# Patient Record
Sex: Female | Born: 1955 | Race: Black or African American | Hispanic: No | Marital: Single | Smoking: Current every day smoker
Health system: Southern US, Community
[De-identification: ages and names within clinical notes are randomized; demographics above are authoritative.]

## PROBLEM LIST (undated history)

## (undated) DIAGNOSIS — F319 Bipolar disorder, unspecified: Secondary | ICD-10-CM

## (undated) DIAGNOSIS — B182 Chronic viral hepatitis C: Secondary | ICD-10-CM

## (undated) DIAGNOSIS — F32A Depression, unspecified: Secondary | ICD-10-CM

## (undated) DIAGNOSIS — B1920 Unspecified viral hepatitis C without hepatic coma: Secondary | ICD-10-CM

## (undated) DIAGNOSIS — E785 Hyperlipidemia, unspecified: Secondary | ICD-10-CM

## (undated) DIAGNOSIS — Z780 Asymptomatic menopausal state: Secondary | ICD-10-CM

## (undated) DIAGNOSIS — D7589 Other specified diseases of blood and blood-forming organs: Secondary | ICD-10-CM

## (undated) DIAGNOSIS — F1110 Opioid abuse, uncomplicated: Secondary | ICD-10-CM

## (undated) DIAGNOSIS — A539 Syphilis, unspecified: Secondary | ICD-10-CM

## (undated) DIAGNOSIS — R011 Cardiac murmur, unspecified: Secondary | ICD-10-CM

## (undated) DIAGNOSIS — E079 Disorder of thyroid, unspecified: Secondary | ICD-10-CM

## (undated) DIAGNOSIS — R87612 Low grade squamous intraepithelial lesion on cytologic smear of cervix (LGSIL): Secondary | ICD-10-CM

## (undated) DIAGNOSIS — D126 Benign neoplasm of colon, unspecified: Secondary | ICD-10-CM

## (undated) DIAGNOSIS — F209 Schizophrenia, unspecified: Secondary | ICD-10-CM

## (undated) DIAGNOSIS — I429 Cardiomyopathy, unspecified: Secondary | ICD-10-CM

## (undated) DIAGNOSIS — K769 Liver disease, unspecified: Secondary | ICD-10-CM

## (undated) DIAGNOSIS — I10 Essential (primary) hypertension: Secondary | ICD-10-CM

## (undated) DIAGNOSIS — F329 Major depressive disorder, single episode, unspecified: Secondary | ICD-10-CM

## (undated) DIAGNOSIS — J449 Chronic obstructive pulmonary disease, unspecified: Secondary | ICD-10-CM

## (undated) DIAGNOSIS — R683 Clubbing of fingers: Secondary | ICD-10-CM

## (undated) DIAGNOSIS — I739 Peripheral vascular disease, unspecified: Secondary | ICD-10-CM

## (undated) HISTORY — DX: Unspecified viral hepatitis C without hepatic coma: B19.20

## (undated) HISTORY — DX: Liver disease, unspecified: K76.9

## (undated) HISTORY — PX: COLONOSCOPY: SHX174

## (undated) HISTORY — DX: Depression, unspecified: F32.A

## (undated) HISTORY — DX: Schizophrenia, unspecified: F20.9

## (undated) HISTORY — DX: Chronic viral hepatitis C: B18.2

## (undated) HISTORY — DX: Major depressive disorder, single episode, unspecified: F32.9

## (undated) HISTORY — DX: Asymptomatic menopausal state: Z78.0

## (undated) HISTORY — DX: Essential (primary) hypertension: I10

## (undated) HISTORY — DX: Syphilis, unspecified: A53.9

## (undated) HISTORY — PX: OTHER SURGICAL HISTORY: SHX169

## (undated) HISTORY — DX: Bipolar disorder, unspecified: F31.9

## (undated) HISTORY — DX: Hyperlipidemia, unspecified: E78.5

## (undated) HISTORY — DX: Disorder of thyroid, unspecified: E07.9

---

## 2003-04-20 ENCOUNTER — Other Ambulatory Visit: Payer: Self-pay

## 2003-05-01 ENCOUNTER — Other Ambulatory Visit: Payer: Self-pay

## 2004-04-15 ENCOUNTER — Ambulatory Visit: Payer: Self-pay

## 2004-05-20 ENCOUNTER — Ambulatory Visit: Payer: Self-pay

## 2004-11-05 ENCOUNTER — Ambulatory Visit: Payer: Self-pay

## 2008-10-16 ENCOUNTER — Emergency Department: Payer: Self-pay | Admitting: Emergency Medicine

## 2010-03-16 ENCOUNTER — Ambulatory Visit: Payer: Self-pay | Admitting: Family Medicine

## 2011-03-25 ENCOUNTER — Ambulatory Visit: Payer: Self-pay | Admitting: Family Medicine

## 2012-05-03 ENCOUNTER — Ambulatory Visit: Payer: Self-pay | Admitting: Family Medicine

## 2013-03-21 ENCOUNTER — Ambulatory Visit: Payer: Self-pay | Admitting: Family Medicine

## 2013-04-24 ENCOUNTER — Ambulatory Visit: Payer: Self-pay | Admitting: Family Medicine

## 2013-05-04 ENCOUNTER — Ambulatory Visit: Payer: Self-pay | Admitting: Family Medicine

## 2014-06-05 ENCOUNTER — Ambulatory Visit: Payer: Self-pay | Admitting: Family Medicine

## 2014-11-01 ENCOUNTER — Other Ambulatory Visit: Payer: Self-pay | Admitting: Family Medicine

## 2014-11-01 DIAGNOSIS — E039 Hypothyroidism, unspecified: Secondary | ICD-10-CM

## 2014-11-05 ENCOUNTER — Ambulatory Visit: Payer: Self-pay

## 2014-11-12 ENCOUNTER — Ambulatory Visit
Admission: RE | Admit: 2014-11-12 | Discharge: 2014-11-12 | Disposition: A | Discharge status: Home / Self Care | Payer: Medicare PPO | Source: Ambulatory Visit | Attending: Family Medicine | Admitting: Family Medicine

## 2014-11-12 DIAGNOSIS — R938 Abnormal findings on diagnostic imaging of other specified body structures: Secondary | ICD-10-CM | POA: Insufficient documentation

## 2014-11-12 DIAGNOSIS — E039 Hypothyroidism, unspecified: Secondary | ICD-10-CM

## 2015-01-08 ENCOUNTER — Other Ambulatory Visit: Payer: Self-pay | Admitting: Psychiatry

## 2015-02-06 ENCOUNTER — Encounter: Payer: Self-pay | Admitting: Psychiatry

## 2015-02-06 ENCOUNTER — Ambulatory Visit (INDEPENDENT_AMBULATORY_CARE_PROVIDER_SITE_OTHER): Payer: Medicare PPO | Admitting: Psychiatry

## 2015-02-06 VITALS — BP 126/82 | HR 60 | Temp 98.2°F | Ht 63.0 in | Wt 143.2 lb

## 2015-02-06 DIAGNOSIS — B1920 Unspecified viral hepatitis C without hepatic coma: Secondary | ICD-10-CM | POA: Insufficient documentation

## 2015-02-06 DIAGNOSIS — I10 Essential (primary) hypertension: Secondary | ICD-10-CM | POA: Insufficient documentation

## 2015-02-06 DIAGNOSIS — Z8619 Personal history of other infectious and parasitic diseases: Secondary | ICD-10-CM | POA: Insufficient documentation

## 2015-02-06 DIAGNOSIS — F319 Bipolar disorder, unspecified: Secondary | ICD-10-CM

## 2015-02-06 DIAGNOSIS — Z923 Personal history of irradiation: Secondary | ICD-10-CM | POA: Insufficient documentation

## 2015-02-06 DIAGNOSIS — F1110 Opioid abuse, uncomplicated: Secondary | ICD-10-CM | POA: Insufficient documentation

## 2015-02-06 DIAGNOSIS — R683 Clubbing of fingers: Secondary | ICD-10-CM | POA: Insufficient documentation

## 2015-02-06 DIAGNOSIS — E079 Disorder of thyroid, unspecified: Secondary | ICD-10-CM | POA: Insufficient documentation

## 2015-02-06 MED ORDER — QUETIAPINE FUMARATE 300 MG PO TABS: 600.0000 mg | ORAL_TABLET | Freq: Every day | ORAL | Status: DC

## 2015-02-06 NOTE — Progress Notes (Signed)
Psychiatric Initial Adult Assessment   Patient Identification: Connie Wright MRN:  [id number] Date of Evaluation:  02/06/2015 Referral Source: Self  Chief Complaint:   Chief Complaint    Follow-up; Medication Refill; Fatigue; Insomnia     Visit Diagnosis: No diagnosis found. Diagnosis:   Patient Active Problem List   Diagnosis Date Noted  . Bipolar 1 disorder, depressed [F31.9] 02/06/2015  . Club nail [L60.9] 02/06/2015  . Personal history of infectious and parasitic disease [Z86.19] 02/06/2015  . HCV (hepatitis C virus) [B19.20] 02/06/2015  . BP (high blood pressure) [I10] 02/06/2015  . Drug abuse, opioid type [F11.10] 02/06/2015  . H/O therapeutic radiation [Z92.3] 02/06/2015  . Disease of thyroid gland [E07.9] 02/06/2015   History of Present Illness:    Patient is a 59 year old female with history of bipolar disorder who presented for the initial assessment. She was previously seen in my practice in September. Patient reported that she thought that she wanted be feeling better so she decided to stop coming to the office. She is continuously taking Seroquel 600 mg at bedtime. She reported that she was treated with [facility]  for her hepatitis C and she has responded  fully. She is very excited and jovial during the interview. She was laughing constantly. She reported that she currently lives with her boyfriend and has 4 grandchildren staying with her at this time. They  will be returning back to school next week. She reported that she has to look for some work to keep herself busy. She currently denied having any suicidal ideations or plans. Reported that the medication really helps her and she is able to sleep well at night. She denied having any perceptual disturbances she denied having any adverse effects of the Seroquel. She wants to continue with Seroquel 600 bedtime. She was prescribed Remeron in the past but she stated that it does not help.   Elements:  Location:   sleep. Associated Signs/Symptoms: Depression Symptoms:  fatigue, disturbed sleep, (Hypo) Manic Symptoms:  mood lability, labile and laughing during the interview Anxiety Symptoms:  denied Psychotic Symptoms:  denied PTSD Symptoms: Negative NA  Past Medical History:  Past Medical History  Diagnosis Date  . Liver disease   . Hep C w/o coma, chronic   . Thyroid disease   . Hypertension   . Hyperlipidemia   . Depression   . Bipolar disorder   . Schizophrenia   . Syphilis   . Post-menopause   . Hepatitis C     Past Surgical History  Procedure Laterality Date  . Cesarean section     Family History:  Family History  Problem Relation Age of Onset  . Cancer - Cervical Mother   . Alcohol abuse Mother   . Depression Mother   . Anxiety disorder Mother   . Alcohol abuse Father   . Anxiety disorder Father   . Depression Father   . Cancer Father   . Alcohol abuse Sister   . Drug abuse Sister   . Anxiety disorder Sister   . Depression Sister   . Bipolar disorder Sister   . Schizophrenia Sister    Social History:   Social History   Social History  . Marital Status: Single    Spouse Name: N/A  . Number of Children: N/A  . Years of Education: N/A   Social History Main Topics  . Smoking status: Current Every Day Smoker -- 0.25 packs/day    Types: Cigarettes    Start date: 02/06/1975  .  Smokeless tobacco: Never Used  . Alcohol Use: No  . Drug Use: No     Comment: h/o IV drug use Cocaine , heavy drinker in the past   . Sexual Activity: Yes    Birth Control/ Protection: None   Other Topics Concern  . None   Social History Narrative  . None   Additional Social History:   Currently living with a boyfriend. She states that she is currently disabled. She denied having any acute issues at this time.  Musculoskeletal: Strength & Muscle Tone: within normal limits Gait & Station: normal Patient leans: N/A  Psychiatric Specialty Exam: HPI  Review of Systems   Psychiatric/Behavioral: Negative for depression and suicidal ideas. The patient is nervous/anxious.   All other systems reviewed and are negative.   Blood pressure 126/82, pulse 60, temperature 98.2 F (36.8 C), temperature source Tympanic, height 5\' 3"  (1.6 m), weight 143 lb 3.2 oz (64.955 kg), SpO2 95 %.Body mass index is 25.37 kg/(m^2).  General Appearance: Casual and Fairly Groomed  Eye Contact:  Fair  Speech:  Clear and Coherent  Volume:  Normal  Mood:  Euphoric  Affect:  Congruent and laughing frequently   Thought Process:  Coherent  Orientation:  Full (Time, Place, and Person)  Thought Content:  WDL  Suicidal Thoughts:  No  Homicidal Thoughts:  No  Memory:  Immediate;   Fair  Judgement:  Fair  Insight:  Fair  Psychomotor Activity:  Normal  Concentration:  Fair  Recall:  [employer] of Knowledge:Fair  Language: Fair  Akathisia:  No  Handed:  Right  AIMS (if indicated):    Assets:  Communication Skills Desire for Improvement Physical Health Social Support  ADL's:  Intact  Cognition: WNL  Sleep:  8-9    Is the patient at risk to self?  No. Has the patient been a risk to self in the past 6 months?  No. Has the patient been a risk to self within the distant past?  No. Is the patient a risk to others?  No. Has the patient been a risk to others in the past 6 months?  No. Has the patient been a risk to others within the distant past?  No.  Allergies:  No Known Allergies Current Medications: Current Outpatient Prescriptions  Medication Sig Dispense Refill  . aspirin 81 MG chewable tablet Chew by mouth.    . carvedilol (COREG) 25 MG tablet Take by mouth.    . Cholecalciferol (VITAMIN D3 SUPER STRENGTH) 2000 UNITS TABS Take by mouth.    . clobetasol cream (TEMOVATE) 0.05 % Apply daily to legs as directed    . furosemide (LASIX) 20 MG tablet Take by mouth.    . lactulose, encephalopathy, (ENULOSE) 10 GM/15ML SOLN Take by mouth.    . levothyroxine (SYNTHROID,  LEVOTHROID) 88 MCG tablet Take by mouth.    . QUEtiapine (SEROQUEL) 300 MG tablet TAKE 2 TABLETS BY MOUTH AT BEDTIME 60 tablet 0  . triamcinolone cream (KENALOG) 0.1 % Apply to affected areas on legs daily 2 weeks on and off     No current facility-administered medications for this visit.    Previous Psychotropic Medications: Tried Seroquel And Remeron in the past. She was following Dr Leonides Schanz at [facility] and she started coming here 3 years ago.   Substance Abuse History in the last 12 months:  Yes.    Heavy use of alcohol and IV cocaine use in the past.  She has been treated for Hepatitis C  by [facility] for almost 10 months.  She feels better and feels that she is Hep C free.   Consequences of Substance Abuse: Negative NA  Medical Decision Making:  Established Problem, Stable/Improving (1)  Treatment Plan Summary: Discussed with patient about her medications treatment risks benefits and alternatives She will  continue on Seroquel 600 mg at bedtime. Prescription was sent to [facility]  order pharmacy. She will follow-up in 3 months    More than 50% of the time spent in psychoeducation, counseling and coordination of care.    This note was generated in part or whole with voice recognition software. Voice regonition is usually quite accurate but there are transcription errors that can and very often do occur. I apologize for any typographical errors that were not detected and corrected.    Rainey Pines 8/25/201610:27 AM

## 2015-02-10 NOTE — Progress Notes (Signed)
This has been reorder, not discontinued.  

## 2015-05-06 ENCOUNTER — Encounter: Payer: Self-pay | Admitting: Psychiatry

## 2015-05-06 ENCOUNTER — Ambulatory Visit (INDEPENDENT_AMBULATORY_CARE_PROVIDER_SITE_OTHER): Payer: Medicare PPO | Admitting: Psychiatry

## 2015-05-06 VITALS — BP 142/88 | HR 69 | Temp 97.8°F | Ht 63.0 in | Wt 138.4 lb

## 2015-05-06 DIAGNOSIS — Z8619 Personal history of other infectious and parasitic diseases: Secondary | ICD-10-CM | POA: Insufficient documentation

## 2015-05-06 DIAGNOSIS — F3170 Bipolar disorder, currently in remission, most recent episode unspecified: Secondary | ICD-10-CM | POA: Diagnosis not present

## 2015-05-06 DIAGNOSIS — F3130 Bipolar disorder, current episode depressed, mild or moderate severity, unspecified: Secondary | ICD-10-CM | POA: Insufficient documentation

## 2015-05-06 NOTE — Progress Notes (Signed)
Psychiatric MD/NP Progress Note   Patient Identification: Connie Wright MRN:  [id number] Date of Evaluation:  05/06/2015 Referral Source: Self  Chief Complaint:   Chief Complaint    Follow-up; Medication Refill     Visit Diagnosis:    ICD-9-CM ICD-10-CM   1. Bipolar affective disorder in remission ([place]) 296.80 F31.70    Diagnosis:   Patient Active Problem List   Diagnosis Date Noted  . Bipolar I disorder, most recent episode depressed ([place]) [F31.30] 05/06/2015  . Personal history of other infectious and parasitic diseases [Z86.19] 05/06/2015  . Bipolar 1 disorder, depressed ([place]) [F31.9] 02/06/2015  . Club nail [L60.9] 02/06/2015  . Personal history of infectious and parasitic disease [Z86.19] 02/06/2015  . HCV (hepatitis C virus) [B19.20] 02/06/2015  . BP (high blood pressure) [I10] 02/06/2015  . Drug abuse, opioid type [F11.10] 02/06/2015  . H/O therapeutic radiation [Z92.3] 02/06/2015  . Disease of thyroid gland [E07.9] 02/06/2015   History of Present Illness:    Patient is a 59 year old female with history of bipolar disorder who presented for the follow-up appointment. She was pleasant and cooperative during the interview. She reported that she has been doing well on her medication and has been taking Seroquel 600 mg at bedtime. Patient reported that she is not having any adverse effects of the medications. She sleeps well and is planning to spend the time with her family during the holidays. Patient reported that she has a grandchild on the way. She is excited about the same. Patient reported that she has completed the treatment for hepatitis C and is recovered fully. She reported that she is changing her insurance next year and is concerned about the same. Her mail order pharmacy is  also changing.. Patient stated that she would keep [place] posted about the same. Patient currently denied having any mood swings anger anxiety or paranoia. She was excited and jovial during the  interview. She was laughing intermittently during the interview and reported she can stop her laughter her and it is not excessive.   (Hypo) Manic Symptoms:  mood lability, labile and laughing during the interview Anxiety Symptoms:  denied Psychotic Symptoms:  denied PTSD Symptoms: Negative NA  Past Medical History:  Past Medical History  Diagnosis Date  . Liver disease   . Hep C w/o coma, chronic ([place])   . Thyroid disease   . Hypertension   . Hyperlipidemia   . Depression   . Bipolar disorder ([place])   . Schizophrenia ([place])   . Syphilis   . Post-menopause   . Hepatitis C     Past Surgical History  Procedure Laterality Date  . Cesarean section     Family History:  Family History  Problem Relation Age of Onset  . Cancer - Cervical Mother   . Alcohol abuse Mother   . Depression Mother   . Anxiety disorder Mother   . Alcohol abuse Father   . Anxiety disorder Father   . Depression Father   . Cancer Father   . Alcohol abuse Sister   . Drug abuse Sister   . Anxiety disorder Sister   . Depression Sister   . Bipolar disorder Sister   . Schizophrenia Sister    Social History:   Social History   Social History  . Marital Status: Single    Spouse Name: N/A  . Number of Children: N/A  . Years of Education: N/A   Social History Main Topics  . Smoking status: Current Every Day Smoker --  0.25 packs/day    Types: Cigarettes    Start date: 02/06/1975  . Smokeless tobacco: Never Used  . Alcohol Use: No  . Drug Use: No     Comment: h/o IV drug use Cocaine , heavy drinker in the past   . Sexual Activity: Yes    Birth Control/ Protection: None   Other Topics Concern  . None   Social History Narrative   Additional Social History:   Currently living with a boyfriend. She states that she is currently disabled. She denied having any acute issues at this time.  Musculoskeletal: Strength & Muscle Tone: within normal limits Gait & Station: normal Patient leans:  N/A  Psychiatric Specialty Exam: HPI   Review of Systems  Psychiatric/Behavioral: Negative for depression and suicidal ideas. The patient is nervous/anxious.   All other systems reviewed and are negative.   Blood pressure 142/88, pulse 69, temperature 97.8 F (36.6 C), temperature source Tympanic, height 5\' 3"  (1.6 m), weight 138 lb 6.4 oz (62.778 kg), SpO2 93 %.Body mass index is 24.52 kg/(m^2).  General Appearance: Casual and Fairly Groomed  Eye Contact:  Fair  Speech:  Clear and Coherent  Volume:  Normal  Mood:  Euphoric  Affect:  Congruent and laughing frequently   Thought Process:  Coherent  Orientation:  Full (Time, Place, and Person)  Thought Content:  WDL  Suicidal Thoughts:  No  Homicidal Thoughts:  No  Memory:  Immediate;   Fair  Judgement:  Fair  Insight:  Fair  Psychomotor Activity:  Normal  Concentration:  Fair  Recall:  [employer] of Knowledge:Fair  Language: Fair  Akathisia:  No  Handed:  Right  AIMS (if indicated):    Assets:  Communication Skills Desire for Improvement Physical Health Social Support  ADL's:  Intact  Cognition: WNL  Sleep:  8-9    Is the patient at risk to self?  No. Has the patient been a risk to self in the past 6 months?  No. Has the patient been a risk to self within the distant past?  No. Is the patient a risk to others?  No. Has the patient been a risk to others in the past 6 months?  No. Has the patient been a risk to others within the distant past?  No.  Allergies:  No Known Allergies Current Medications: Current Outpatient Prescriptions  Medication Sig Dispense Refill  . aspirin 81 MG chewable tablet Chew by mouth.    . carvedilol (COREG) 25 MG tablet Take by mouth.    . Cholecalciferol (VITAMIN D3 SUPER STRENGTH) 2000 UNITS TABS Take by mouth.    . furosemide (LASIX) 20 MG tablet Take by mouth.    . lactulose, encephalopathy, (ENULOSE) 10 GM/15ML SOLN Take by mouth.    . levothyroxine (SYNTHROID, LEVOTHROID) 88 MCG  tablet Take by mouth.    . QUEtiapine (SEROQUEL) 300 MG tablet Take 2 tablets (600 mg total) by mouth at bedtime. 180 tablet 3  . triamcinolone cream (KENALOG) 0.1 % Apply to affected areas on legs daily 2 weeks on and off     No current facility-administered medications for this visit.    Previous Psychotropic Medications: Tried Seroquel And Remeron in the past. She was following Dr Leonides Schanz at [place] and she started coming here 3 years ago.   Substance Abuse History in the last 12 months:  Yes.    Heavy use of alcohol and IV cocaine use in the past.  She has been treated for Hepatitis  C by [facility] for almost 10 months.  She feels better and feels that she is Hep C free.   Consequences of Substance Abuse: Negative NA  Medical Decision Making:  Established Problem, Stable/Improving (1)  Treatment Plan Summary: Discussed with patient about her medications treatment risks benefits and alternatives She will  continue on Seroquel 600 mg at bedtime.  Patient has enough supply of the medications. She will follow-up in 6 months   More than 50% of the time spent in psychoeducation, counseling and coordination of care.    This note was generated in part or whole with voice recognition software. Voice regonition is usually quite accurate but there are transcription errors that can and very often do occur. I apologize for any typographical errors that were not detected and corrected.   Rainey Pines, MD  11/22/201610:07 AM

## 2015-08-10 IMAGING — US US EXTREM LOW VENOUS*R*
1 series · 14 of 22 positions shown · non-contrast
Comparison: none

REASON FOR EXAM: rt inner thigh pain cause gait problem eval thrombus
CALL REPORT [phone]
COMMENTS:

PROCEDURE:     ZEYA - ZEYA DOPPLER VEINS RT LEG EXTR  - March 21, 2013  [DATE]
RESULT:     Comparison: None

[Series 1: us extrem low venous*right* · 0.09mm/px · 14 of 22 slices shown]
[im 1/22]
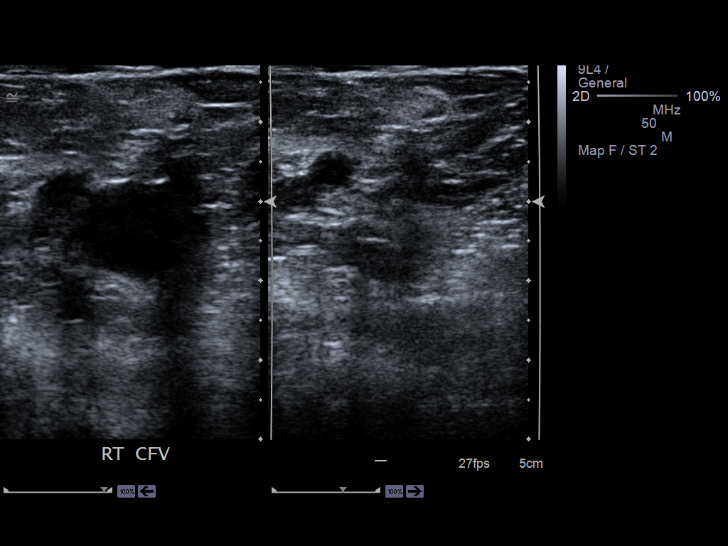
[im 3/22]
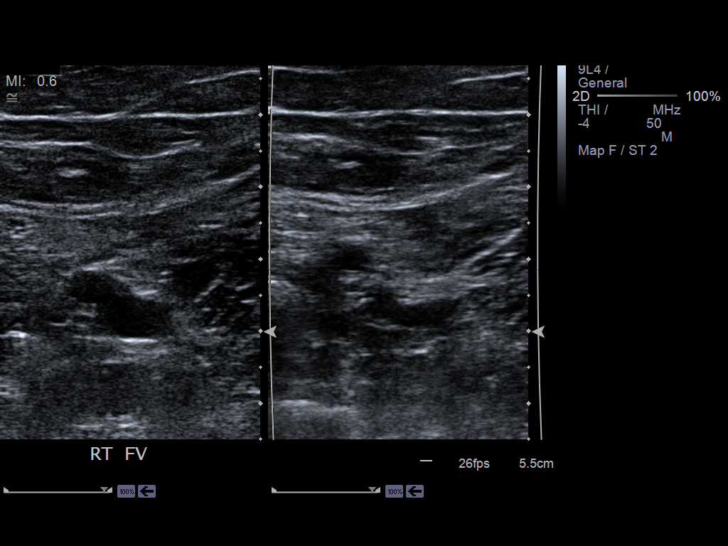
[im 4/22]
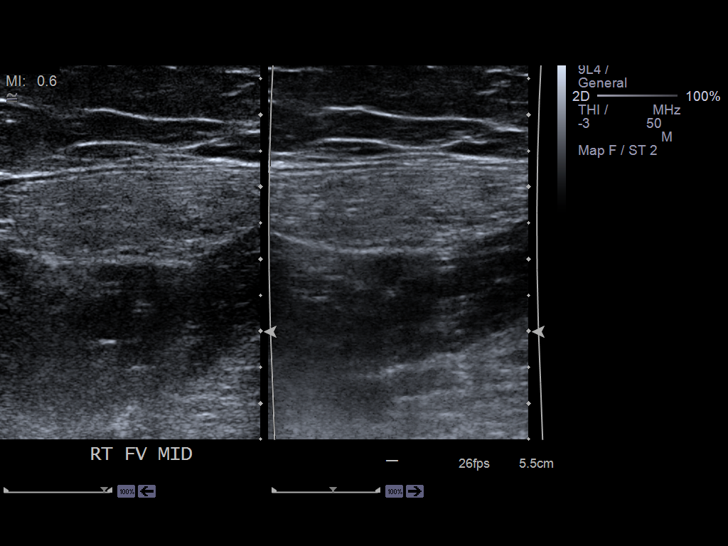
[im 6/22]
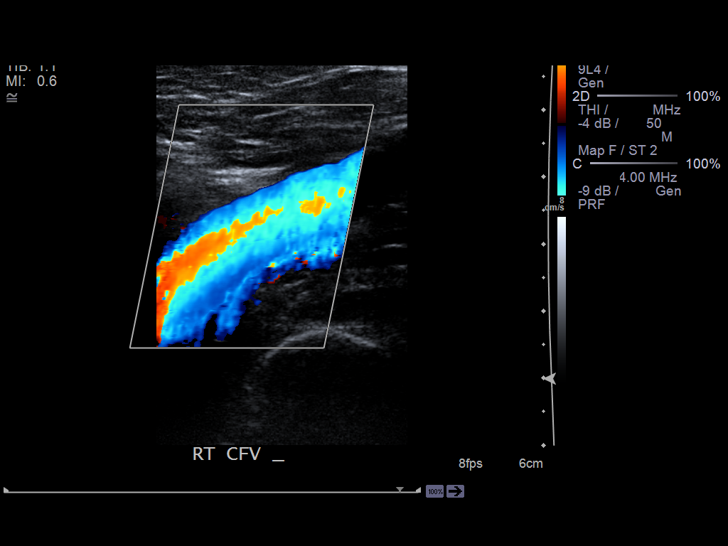
[im 8/22]
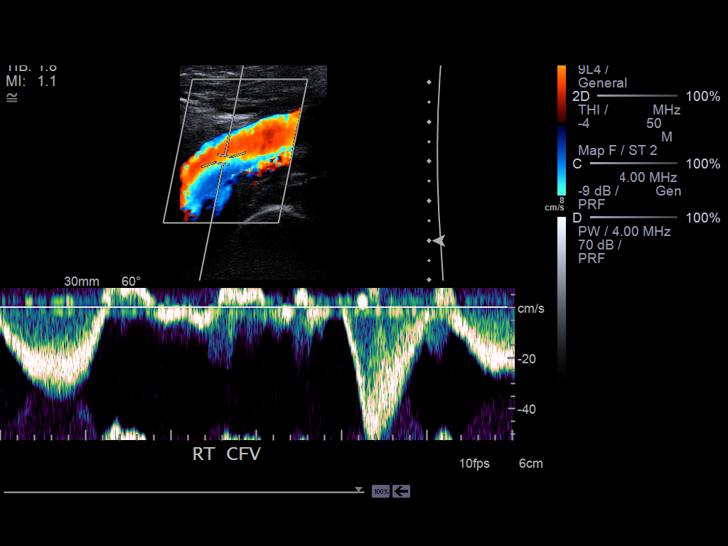
[im 9/22]
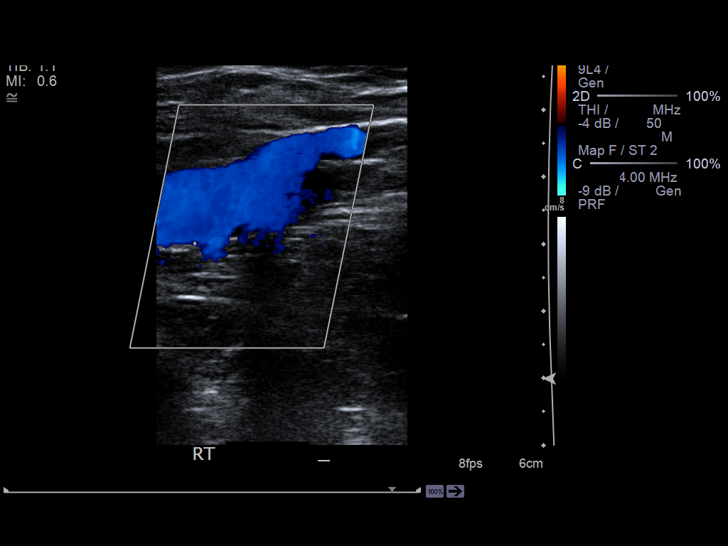
[im 11/22]
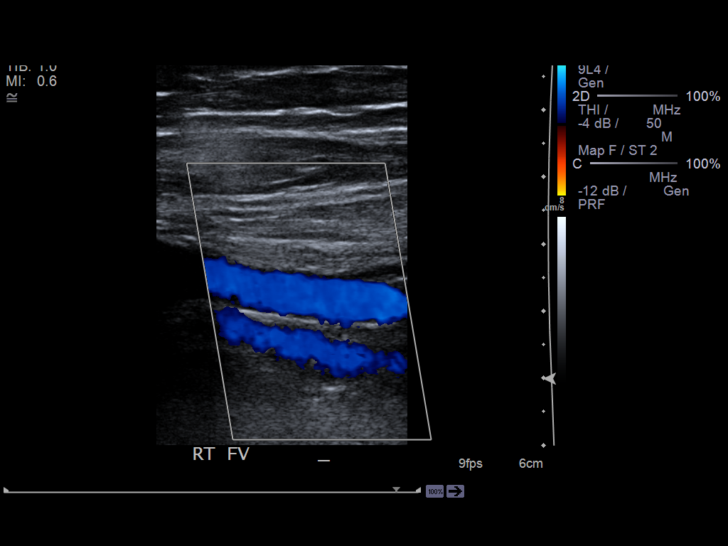
[im 12/22]
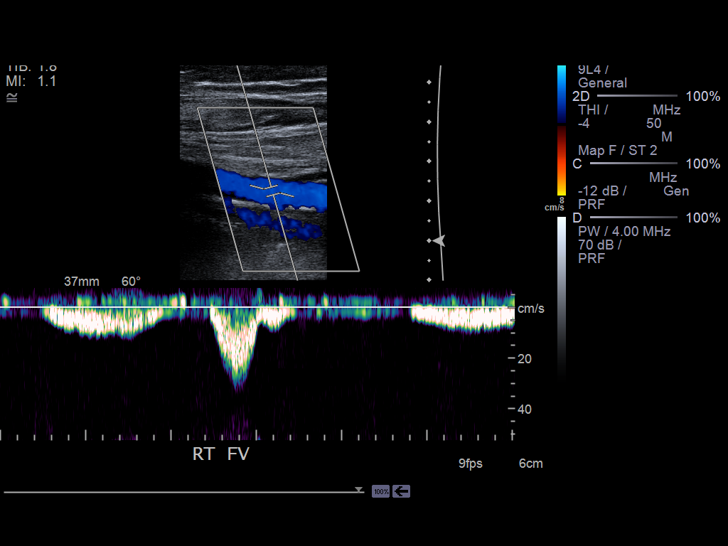
[im 14/22]
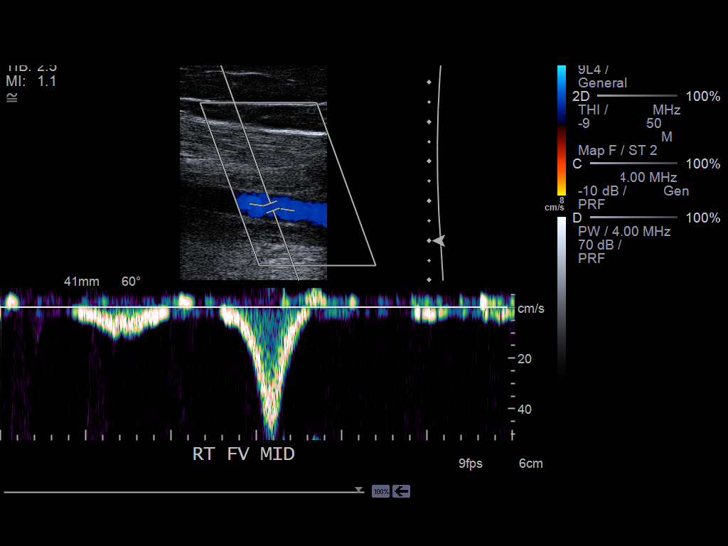
[im 15/22]
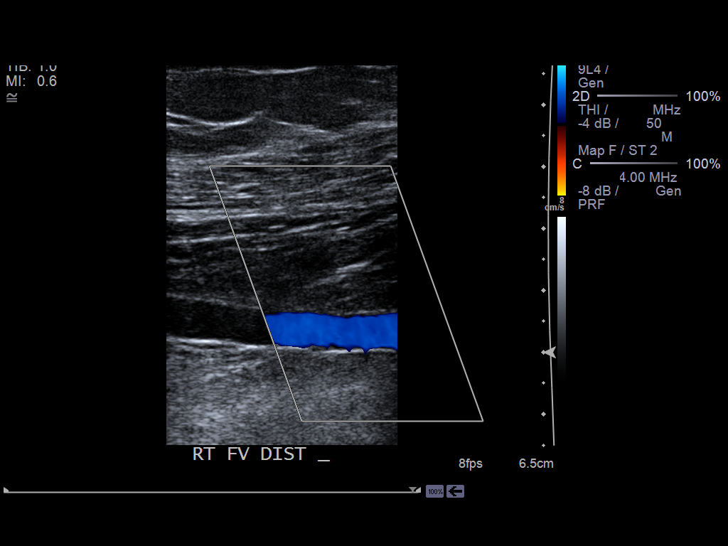
[im 17/22]
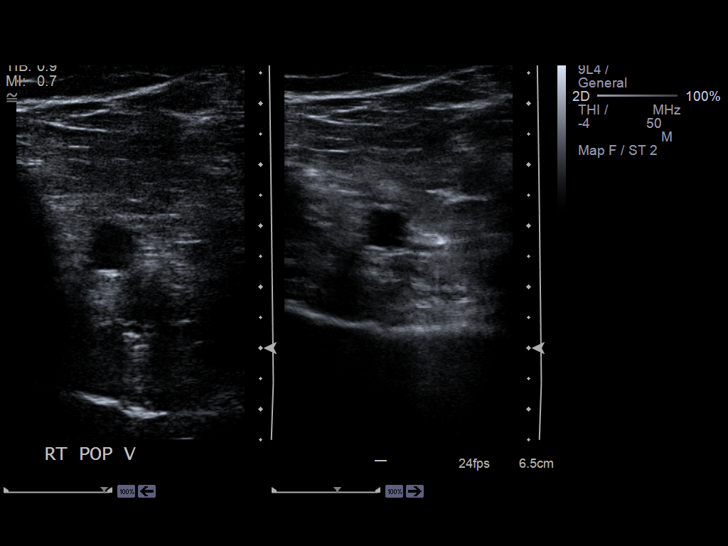
[im 19/22]
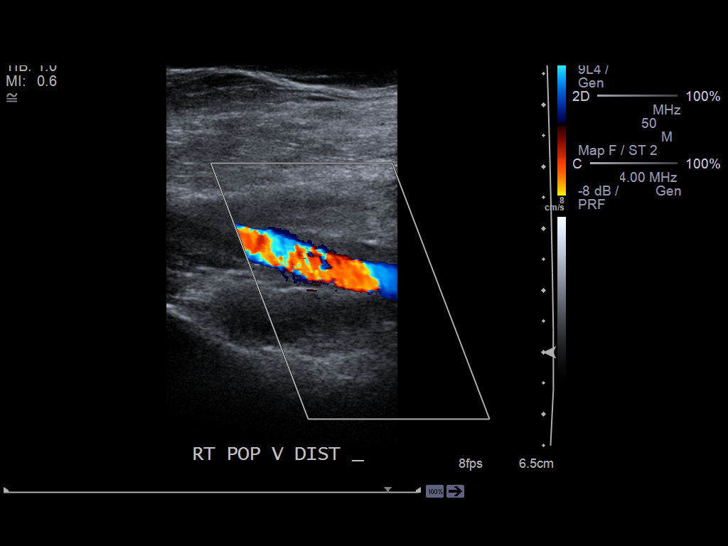
[im 20/22]
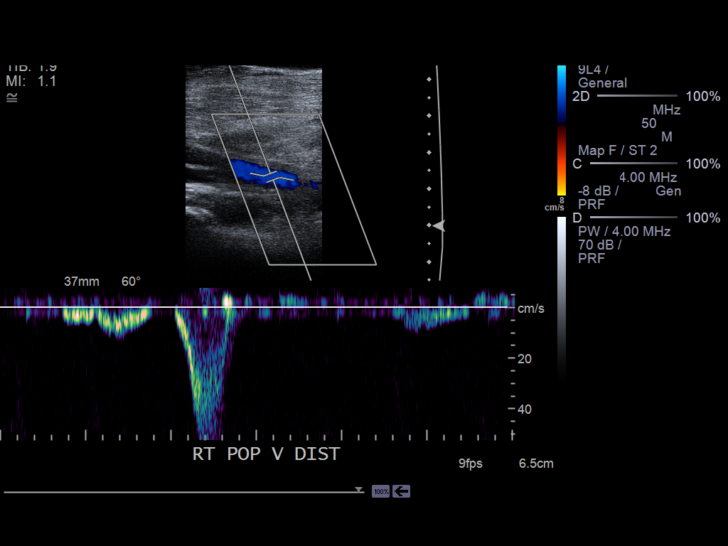
[im 22/22]
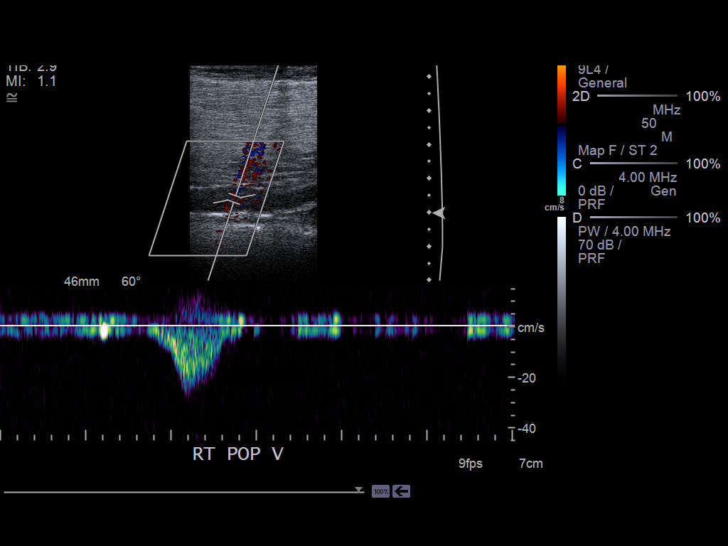

[14 of 22 positions shown; findings below may reference images not displayed]

FINDINGS: Multiple longitudinal and transverse gray-scale as well as color
and spectral Doppler images of the right lower extremity veins were obtained
from the common femoral veins through the popliteal veins.

The right common femoral, greater saphenous, femoral, popliteal veins, and
venous trifurcation are patent, demonstrating normal color-flow and
compressibility. No intraluminal thrombus is identified.There is normal
respiratory variation and augmentation demonstrated at all vein levels.
IMPRESSION: No evidence of DVT in the right lower extremity.

[REDACTED]

## 2015-08-10 IMAGING — CR RIGHT HIP - COMPLETE 2+ VIEW
1 series · 2 of 2 positions shown · non-contrast
Comparison: none

REASON FOR EXAM: right anterior medial thigh pain, evaluate for bony
abnormality
COMMENTS:

PROCEDURE:     MDR - MDR HIP RIGHT COMPLETE  - March 21, 2013 [DATE]
RESULT:     Comparison:  None

[Series 1: ap · 0.17mm/px · 2 of 2 slices shown]
[im 1/2]
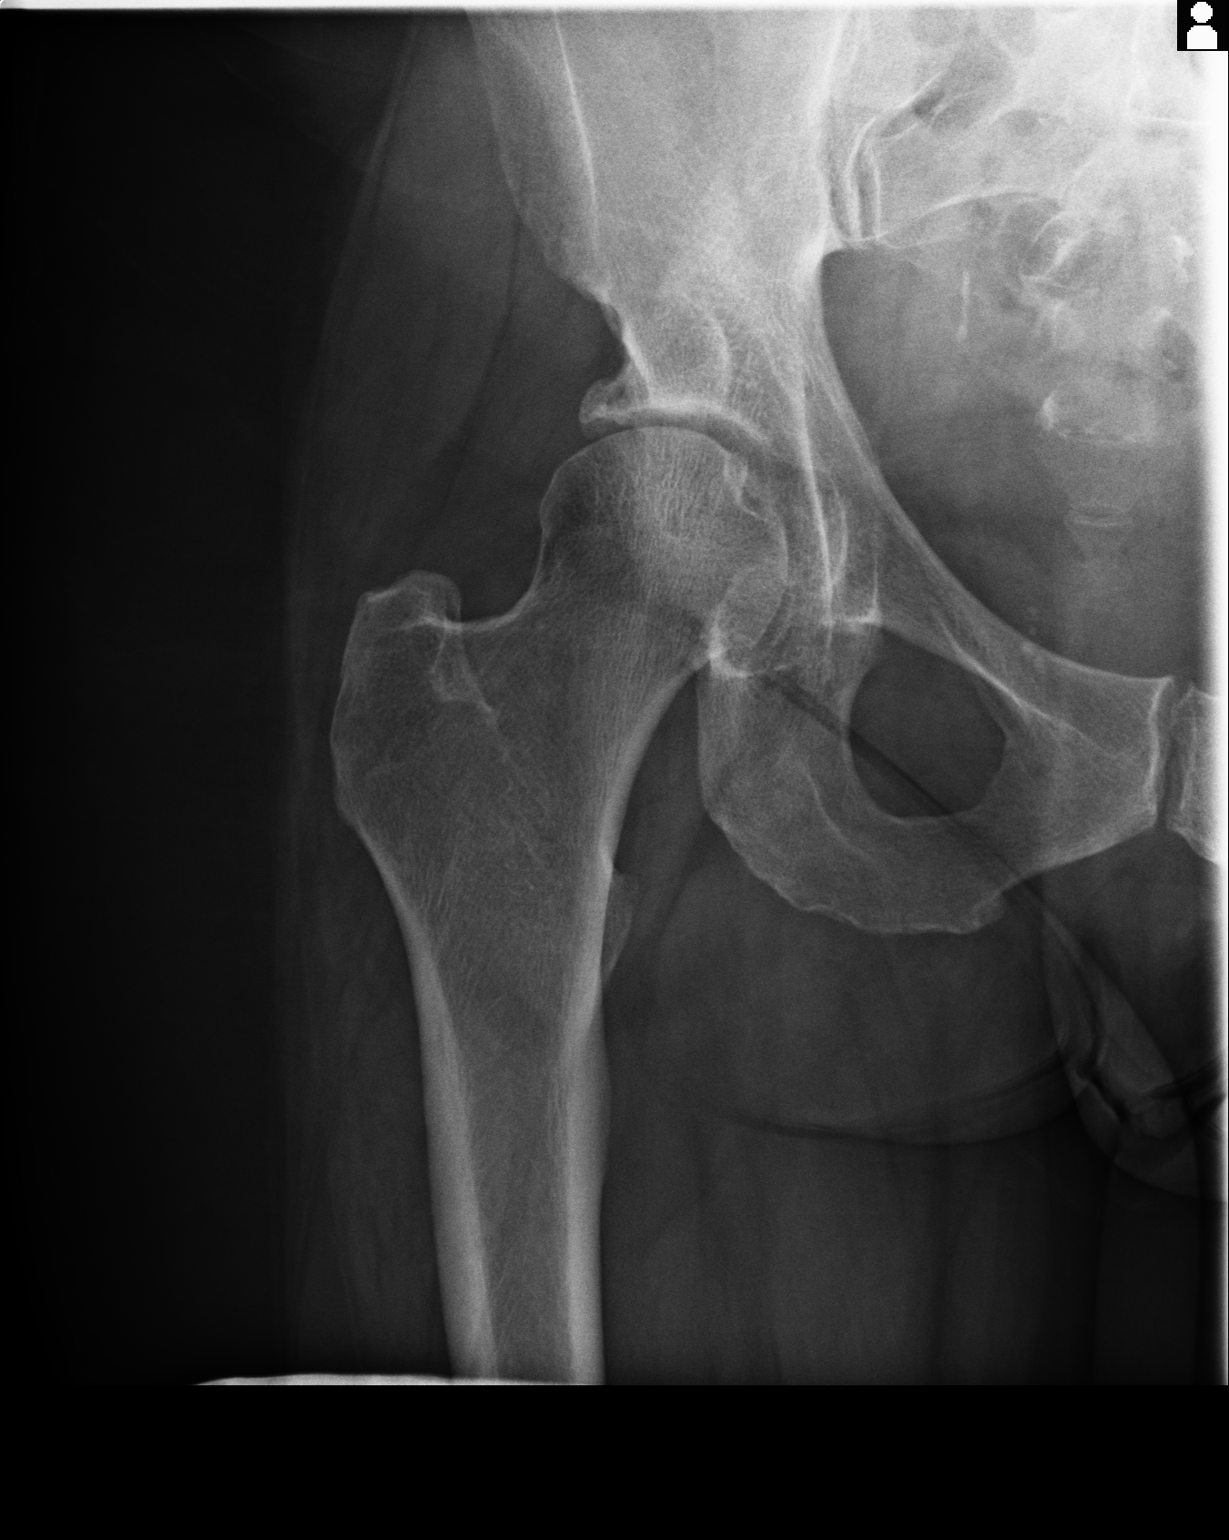
[im 2/2]
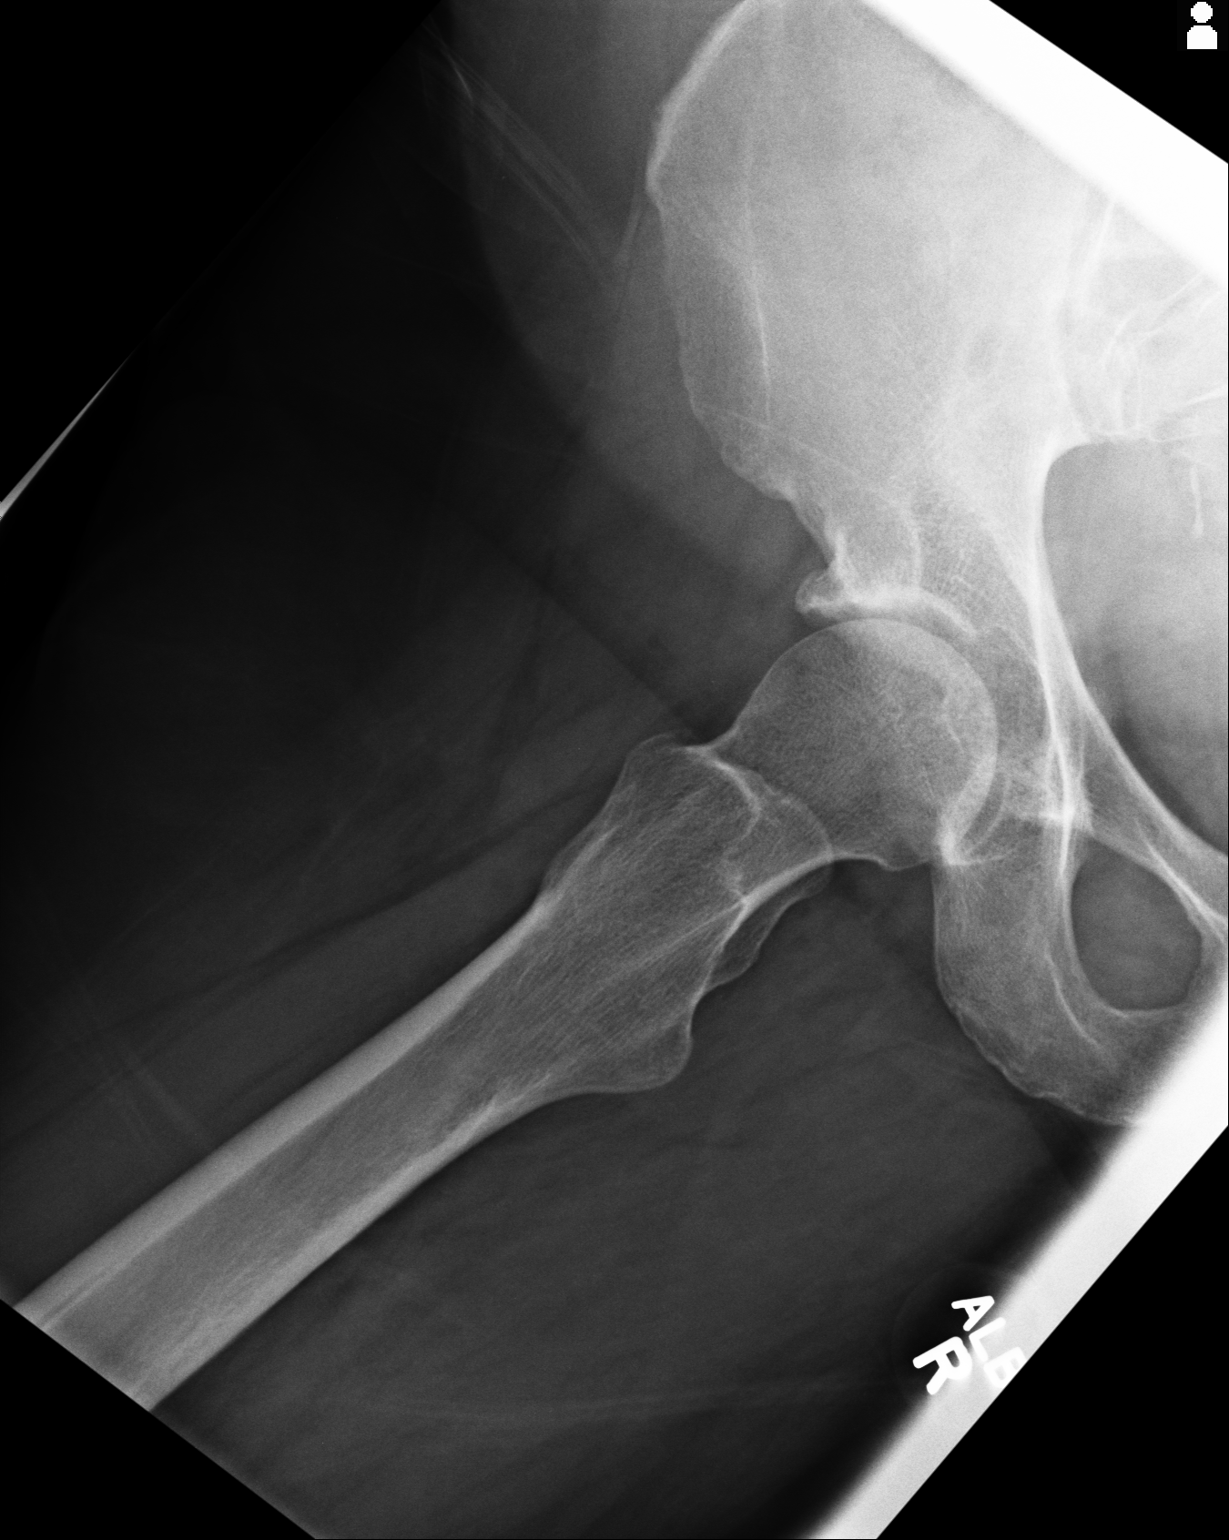

[2 of 2 positions shown; findings below may reference images not displayed]

FINDINGS: AP and frogleg lateral view of the right hip demonstrates no fracture or
dislocation. The joint space is maintained.
IMPRESSION: No acute osseous injury of the right hip.

[REDACTED]

## 2015-11-04 ENCOUNTER — Ambulatory Visit (INDEPENDENT_AMBULATORY_CARE_PROVIDER_SITE_OTHER): Payer: 59 | Admitting: Psychiatry

## 2015-11-04 ENCOUNTER — Encounter: Payer: Self-pay | Admitting: Psychiatry

## 2015-11-04 VITALS — BP 138/84 | HR 69 | Temp 97.3°F | Ht 63.0 in | Wt 135.2 lb

## 2015-11-04 DIAGNOSIS — F3160 Bipolar disorder, current episode mixed, unspecified: Secondary | ICD-10-CM

## 2015-11-04 MED ORDER — QUETIAPINE FUMARATE 300 MG PO TABS: 600.0000 mg | ORAL_TABLET | Freq: Every day | ORAL | Status: DC

## 2015-11-04 MED ORDER — VITAMIN D3 10 MCG (400 UNIT) PO TABS: 400.0000 [IU] | ORAL_TABLET | Freq: Every day | ORAL | Status: AC

## 2015-11-04 NOTE — Progress Notes (Signed)
Psychiatric MD/NP Progress Note   Patient Identification: Connie Wright MRN:  [id number] Date of Evaluation:  11/04/2015 Referral Source: Self  Chief Complaint:   Chief Complaint    Follow-up; Medication Refill     Visit Diagnosis:    ICD-9-CM ICD-10-CM   1. Bipolar I disorder, most recent episode mixed ([place]) 296.60 F31.60    Diagnosis:   Patient Active Problem List   Diagnosis Date Noted  . Bipolar I disorder, most recent episode depressed ([place]) [F31.30] 05/06/2015  . Personal history of other infectious and parasitic diseases [Z86.19] 05/06/2015  . Bipolar 1 disorder, depressed ([place]) [F31.9] 02/06/2015  . Club nail [L60.9] 02/06/2015  . Personal history of infectious and parasitic disease [Z86.19] 02/06/2015  . HCV (hepatitis C virus) [B19.20] 02/06/2015  . BP (high blood pressure) [I10] 02/06/2015  . Drug abuse, opioid type [F11.10] 02/06/2015  . H/O therapeutic radiation [Z92.3] 02/06/2015  . Disease of thyroid gland [E07.9] 02/06/2015   History of Present Illness:    Patient is a 60 year old female with history of bipolar disorder who presented for the follow-up appointment. She was pleasant and cooperative during the interview. She Was last seen in November. Patient reported that she has been doing well on her medication has been taking Seroquel 600 mg at bedtime. Patient was excited and jovial during the interview as she reported that she had a grand baby 3 weeks ago. She has 5 grandchildren's now and she is going to spend the summers with them. She brought a list of her medications. She also brought her labs during this appointment. We discussed her labs in detail. She has slightly elevated cholesterol and LDL. We discussed about her diet and habits in detail. Patient agreed with the plan that she is going to modify her diet due to her high dose of Seroquel as well. She is not interested in changing her medications as she is doing well and stable for many years.She was laughing  intermittently during the interview and reported she can stop her laughter her and it is not excessive.   (Hypo) Manic Symptoms:  mood lability, labile and laughing during the interview Anxiety Symptoms:  denied Psychotic Symptoms:  denied PTSD Symptoms: Negative NA  Past Medical History:  Past Medical History  Diagnosis Date  . Liver disease   . Hep C w/o coma, chronic ([place])   . Thyroid disease   . Hypertension   . Hyperlipidemia   . Depression   . Bipolar disorder ([place])   . Schizophrenia ([place])   . Syphilis   . Post-menopause   . Hepatitis C     Past Surgical History  Procedure Laterality Date  . Cesarean section     Family History:  Family History  Problem Relation Age of Onset  . Cancer - Cervical Mother   . Alcohol abuse Mother   . Depression Mother   . Anxiety disorder Mother   . Alcohol abuse Father   . Anxiety disorder Father   . Depression Father   . Cancer Father   . Alcohol abuse Sister   . Drug abuse Sister   . Anxiety disorder Sister   . Depression Sister   . Bipolar disorder Sister   . Schizophrenia Sister    Social History:   Social History   Social History  . Marital Status: Single    Spouse Name: N/A  . Number of Children: N/A  . Years of Education: N/A   Social History Main Topics  . Smoking status: Current  Every Day Smoker -- 0.25 packs/day    Types: Cigarettes    Start date: 02/06/1975  . Smokeless tobacco: Never Used  . Alcohol Use: No  . Drug Use: No     Comment: h/o IV drug use Cocaine , heavy drinker in the past   . Sexual Activity: Yes    Birth Control/ Protection: None   Other Topics Concern  . None   Social History Narrative   Additional Social History:   Currently living with a boyfriend. She states that she is currently disabled. She denied having any acute issues at this time.  Musculoskeletal: Strength & Muscle Tone: within normal limits Gait & Station: normal Patient leans: N/A  Psychiatric Specialty  Exam: HPI  Review of Systems  Psychiatric/Behavioral: Negative for depression and suicidal ideas. The patient is nervous/anxious.   All other systems reviewed and are negative.   Blood pressure 138/84, pulse 69, temperature 97.3 F (36.3 C), temperature source Tympanic, height 5\' 3"  (1.6 m), weight 135 lb 3.2 oz (61.326 kg), SpO2 94 %.Body mass index is 23.96 kg/(m^2).  General Appearance: Casual and Fairly Groomed  Eye Contact:  Fair  Speech:  Clear and Coherent  Volume:  Normal  Mood:  Euphoric  Affect:  Congruent and laughing frequently   Thought Process:  Coherent  Orientation:  Full (Time, Place, and Person)  Thought Content:  WDL  Suicidal Thoughts:  No  Homicidal Thoughts:  No  Memory:  Immediate;   Fair  Judgement:  Fair  Insight:  Fair  Psychomotor Activity:  Normal  Concentration:  Fair  Recall:  [employer] of Knowledge:Fair  Language: Fair  Akathisia:  No  Handed:  Right  AIMS (if indicated):    Assets:  Communication Skills Desire for Improvement Physical Health Social Support  ADL's:  Intact  Cognition: WNL  Sleep:  8-9    Is the patient at risk to self?  No. Has the patient been a risk to self in the past 6 months?  No. Has the patient been a risk to self within the distant past?  No. Is the patient a risk to others?  No. Has the patient been a risk to others in the past 6 months?  No. Has the patient been a risk to others within the distant past?  No.  Allergies:  No Known Allergies Current Medications: Current Outpatient Prescriptions  Medication Sig Dispense Refill  . aspirin 81 MG chewable tablet Chew by mouth.    . carvedilol (COREG) 25 MG tablet Take by mouth.    . Cholecalciferol (VITAMIN D3 SUPER STRENGTH) 2000 UNITS TABS Take by mouth.    . furosemide (LASIX) 20 MG tablet Take by mouth.    . lactulose, encephalopathy, (ENULOSE) 10 GM/15ML SOLN Take by mouth.    . levothyroxine (SYNTHROID, LEVOTHROID) 88 MCG tablet Take by mouth.    .  QUEtiapine (SEROQUEL) 300 MG tablet Take 2 tablets (600 mg total) by mouth at bedtime. 180 tablet 3  . triamcinolone cream (KENALOG) 0.1 % Apply to affected areas on legs daily 2 weeks on and off     No current facility-administered medications for this visit.    Previous Psychotropic Medications: Tried Seroquel And Remeron in the past. She was following Dr Leonides Schanz at [facility] and she started coming here 3 years ago.   Substance Abuse History in the last 12 months:  Yes.    Heavy use of alcohol and IV cocaine use in the past.  She has been  treated for Hepatitis C by [facility] for almost 10 months.  She feels better and feels that she is Hep C free.   Consequences of Substance Abuse: Negative NA  Medical Decision Making:  Established Problem, Stable/Improving (1)  Treatment Plan Summary: Advised patient to modify her diet for the next 3 months and will continue to monitor her cholesterol. She will continue on her medications as prescribed. Discussed with patient about her medications treatment risks benefits and alternatives She will  continue on Seroquel 600 mg at bedtime. Follow-up in 3 months   More than 50% of the time spent in psychoeducation, counseling and coordination of care.    This note was generated in part or whole with voice recognition software. Voice regonition is usually quite accurate but there are transcription errors that can and very often do occur. I apologize for any typographical errors that were not detected and corrected.   Rainey Pines, MD  5/23/201710:06 AM

## 2015-11-07 ENCOUNTER — Other Ambulatory Visit: Payer: Self-pay | Admitting: Family Medicine

## 2015-11-07 DIAGNOSIS — Z1231 Encounter for screening mammogram for malignant neoplasm of breast: Secondary | ICD-10-CM

## 2015-11-12 ENCOUNTER — Ambulatory Visit: Payer: Medicare PPO

## 2015-12-17 ENCOUNTER — Ambulatory Visit: Payer: Medicare PPO

## 2015-12-24 ENCOUNTER — Ambulatory Visit
Admission: RE | Admit: 2015-12-24 | Discharge: 2015-12-24 | Disposition: A | Discharge status: Home / Self Care | Payer: Medicare Other | Source: Ambulatory Visit | Attending: Family Medicine | Admitting: Family Medicine

## 2015-12-24 DIAGNOSIS — Z1231 Encounter for screening mammogram for malignant neoplasm of breast: Secondary | ICD-10-CM | POA: Diagnosis not present

## 2015-12-24 DIAGNOSIS — R928 Other abnormal and inconclusive findings on diagnostic imaging of breast: Secondary | ICD-10-CM | POA: Diagnosis not present

## 2016-01-06 ENCOUNTER — Other Ambulatory Visit: Payer: Self-pay | Admitting: Family Medicine

## 2016-01-06 DIAGNOSIS — R928 Other abnormal and inconclusive findings on diagnostic imaging of breast: Secondary | ICD-10-CM

## 2016-01-06 DIAGNOSIS — N6320 Unspecified lump in the left breast, unspecified quadrant: Secondary | ICD-10-CM

## 2016-01-14 ENCOUNTER — Ambulatory Visit
Admission: RE | Admit: 2016-01-14 | Discharge: 2016-01-14 | Disposition: A | Discharge status: Home / Self Care | Payer: Medicare Other | Source: Ambulatory Visit | Attending: Family Medicine | Admitting: Family Medicine

## 2016-01-14 DIAGNOSIS — N6320 Unspecified lump in the left breast, unspecified quadrant: Secondary | ICD-10-CM

## 2016-01-14 DIAGNOSIS — N63 Unspecified lump in breast: Secondary | ICD-10-CM | POA: Diagnosis not present

## 2016-01-14 DIAGNOSIS — R928 Other abnormal and inconclusive findings on diagnostic imaging of breast: Secondary | ICD-10-CM

## 2016-02-04 ENCOUNTER — Ambulatory Visit: Payer: Medicare Other | Admitting: Psychiatry

## 2016-07-30 ENCOUNTER — Other Ambulatory Visit: Payer: Self-pay | Admitting: Family Medicine

## 2016-07-30 DIAGNOSIS — R928 Other abnormal and inconclusive findings on diagnostic imaging of breast: Secondary | ICD-10-CM

## 2016-08-05 ENCOUNTER — Other Ambulatory Visit: Payer: Self-pay | Admitting: Family Medicine

## 2016-08-05 DIAGNOSIS — R928 Other abnormal and inconclusive findings on diagnostic imaging of breast: Secondary | ICD-10-CM

## 2016-08-18 ENCOUNTER — Ambulatory Visit
Admission: RE | Admit: 2016-08-18 | Discharge: 2016-08-18 | Disposition: A | Discharge status: Home / Self Care | Payer: Medicare Other | Source: Ambulatory Visit | Attending: Family Medicine | Admitting: Family Medicine

## 2016-08-18 DIAGNOSIS — R928 Other abnormal and inconclusive findings on diagnostic imaging of breast: Secondary | ICD-10-CM | POA: Insufficient documentation

## 2016-08-18 DIAGNOSIS — N6320 Unspecified lump in the left breast, unspecified quadrant: Secondary | ICD-10-CM | POA: Diagnosis not present

## 2016-08-20 ENCOUNTER — Other Ambulatory Visit: Payer: Self-pay | Admitting: Family Medicine

## 2016-08-20 DIAGNOSIS — R928 Other abnormal and inconclusive findings on diagnostic imaging of breast: Secondary | ICD-10-CM

## 2016-08-20 DIAGNOSIS — Z87898 Personal history of other specified conditions: Secondary | ICD-10-CM

## 2016-12-28 ENCOUNTER — Ambulatory Visit
Admission: RE | Admit: 2016-12-28 | Discharge: 2016-12-28 | Disposition: A | Discharge status: Home / Self Care | Payer: Medicare Other | Source: Ambulatory Visit | Attending: Family Medicine | Admitting: Family Medicine

## 2016-12-28 DIAGNOSIS — R928 Other abnormal and inconclusive findings on diagnostic imaging of breast: Secondary | ICD-10-CM

## 2016-12-28 DIAGNOSIS — Z87898 Personal history of other specified conditions: Secondary | ICD-10-CM

## 2016-12-28 DIAGNOSIS — N6322 Unspecified lump in the left breast, upper inner quadrant: Secondary | ICD-10-CM | POA: Diagnosis not present

## 2017-05-20 ENCOUNTER — Other Ambulatory Visit: Payer: Self-pay | Admitting: Family Medicine

## 2017-05-20 DIAGNOSIS — R928 Other abnormal and inconclusive findings on diagnostic imaging of breast: Secondary | ICD-10-CM

## 2018-02-15 ENCOUNTER — Ambulatory Visit
Admission: RE | Admit: 2018-02-15 | Discharge: 2018-02-15 | Disposition: A | Discharge status: Home / Self Care | Payer: Medicare Other | Source: Ambulatory Visit | Attending: Family Medicine | Admitting: Family Medicine

## 2018-02-15 DIAGNOSIS — R928 Other abnormal and inconclusive findings on diagnostic imaging of breast: Secondary | ICD-10-CM

## 2018-05-31 ENCOUNTER — Other Ambulatory Visit: Payer: Self-pay | Admitting: Gastroenterology

## 2018-05-31 DIAGNOSIS — R1084 Generalized abdominal pain: Secondary | ICD-10-CM

## 2018-06-05 ENCOUNTER — Other Ambulatory Visit: Payer: Self-pay | Admitting: Family Medicine

## 2018-06-05 ENCOUNTER — Ambulatory Visit
Admission: RE | Admit: 2018-06-05 | Discharge: 2018-06-05 | Disposition: A | Discharge status: Home / Self Care | Payer: Medicare Other | Source: Ambulatory Visit | Attending: Gastroenterology | Admitting: Gastroenterology

## 2018-06-05 ENCOUNTER — Encounter (INDEPENDENT_AMBULATORY_CARE_PROVIDER_SITE_OTHER): Payer: Self-pay

## 2018-06-05 DIAGNOSIS — R1084 Generalized abdominal pain: Secondary | ICD-10-CM | POA: Insufficient documentation

## 2018-06-05 DIAGNOSIS — E079 Disorder of thyroid, unspecified: Secondary | ICD-10-CM

## 2018-06-05 MED ORDER — IOPAMIDOL (ISOVUE-300) INJECTION 61%
100.0000 mL | Freq: Once | INTRAVENOUS | Status: AC | PRN
  Administered 2018-06-05: 100 mL via INTRAVENOUS

## 2018-06-13 ENCOUNTER — Ambulatory Visit
Admission: RE | Admit: 2018-06-13 | Discharge: 2018-06-13 | Disposition: A | Discharge status: Home / Self Care | Payer: Medicare Other | Source: Ambulatory Visit | Attending: Family Medicine | Admitting: Family Medicine

## 2018-06-13 DIAGNOSIS — E079 Disorder of thyroid, unspecified: Secondary | ICD-10-CM | POA: Diagnosis present

## 2018-06-28 NOTE — Progress Notes (Signed)
06/28/2018 9:58 AM   Connie Wright Oct 21, 1955 [id number]  Referring provider: Gayland Curry, MD [address] [place], [place] [address]  Chief Complaint  Patient presents with  . Hydronephrosis    HPI: Connie Wright is a 63 yo F who presents today for the evaluation and management of right hydronephrosis. He was referred to [place] by Ok Edwards, NP.   Today she reports of abdominal and flank pain that has been going on for years. Patient denies any gross hematuria and dysuria. Patient denies any fevers, chills, nausea or vomiting. She does report of hot flashes.   She had a CT abdomen pelvis with contrast ordered after her visit with Gastroenterologist which indicated moderate right hydronephrosis due to 5 mm calculus in proximal right ureter on 06/05/2018.  She reports that she was unaware of the stone.  She is never had previous stone episodes.  She has not seen the stone pass.   PMH: Past Medical History:  Diagnosis Date  . Bipolar disorder ([place])   . Depression   . Hep C w/o coma, chronic ([place])   . Hepatitis C   . Hyperlipidemia   . Hypertension   . Liver disease   . Post-menopause   . Schizophrenia ([place])   . Syphilis   . Thyroid disease     Surgical History: Past Surgical History:  Procedure Laterality Date  . CESAREAN SECTION      Home Medications:  Allergies as of 06/29/2018   No Known Allergies     Medication List       Accurate as of June 29, 2018  9:58 AM. Always use your most recent med list.        aspirin 81 MG chewable tablet Chew by mouth.   atorvastatin 10 MG tablet Commonly known as:  LIPITOR Take by mouth.   carvedilol 25 MG tablet Commonly known as:  COREG Take by mouth.   ENULOSE 10 GM/15ML Soln Generic drug:  lactulose (encephalopathy) Take by mouth.   furosemide 20 MG tablet Commonly known as:  LASIX Take by mouth.   lactulose 10 GM/15ML solution Commonly known as:  CHRONULAC Take by mouth.     levothyroxine 88 MCG tablet Commonly known as:  SYNTHROID, LEVOTHROID Take by mouth.   metroNIDAZOLE 500 MG tablet Commonly known as:  FLAGYL   mirtazapine 15 MG tablet Commonly known as:  REMERON Take by mouth.   QUEtiapine 300 MG tablet Commonly known as:  SEROQUEL Take 2 tablets (600 mg total) by mouth at bedtime.   triamcinolone cream 0.1 % Commonly known as:  KENALOG Apply to affected areas on legs daily 2 weeks on and off   VITAMIN D3 SUPER STRENGTH 50 MCG (2000 UT) Tabs Generic drug:  Cholecalciferol Take by mouth.   Vitamin D3 10 MCG (400 UNIT) tablet Take 1 tablet (400 Units total) by mouth daily.       Allergies: No Known Allergies  Family History: Family History  Problem Relation Age of Onset  . Cancer - Cervical Mother   . Alcohol abuse Mother   . Depression Mother   . Anxiety disorder Mother   . Alcohol abuse Father   . Anxiety disorder Father   . Depression Father   . Cancer Father   . Alcohol abuse Sister   . Drug abuse Sister   . Anxiety disorder Sister   . Depression Sister   . Bipolar disorder Sister   . Schizophrenia Sister   . Breast cancer  Neg Hx     Social History:  reports that she has been smoking cigarettes. She started smoking about 43 years ago. She has been smoking about 0.25 packs per day. She has never used smokeless tobacco. She reports that she does not drink alcohol or use drugs.  ROS: UROLOGY Frequent Urination?: No Hard to postpone urination?: No Burning/pain with urination?: No Get up at night to urinate?: No Leakage of urine?: No Urine stream starts and stops?: No Trouble starting stream?: No Do you have to strain to urinate?: No Blood in urine?: No Urinary tract infection?: No Sexually transmitted disease?: No Injury to kidneys or bladder?: No Painful intercourse?: No Weak stream?: No Currently pregnant?: No Vaginal bleeding?: No Last menstrual period?: n  Gastrointestinal Nausea?: No Vomiting?:  No Indigestion/heartburn?: No Diarrhea?: No Constipation?: No  Constitutional Fever: No Night sweats?: No Weight loss?: No Fatigue?: No  Skin Skin rash/lesions?: No Itching?: No  Eyes Blurred vision?: No Double vision?: No  Ears/Nose/Throat Sore throat?: No Sinus problems?: No  Hematologic/Lymphatic Swollen glands?: No Easy bruising?: No  Cardiovascular Leg swelling?: No Chest pain?: No  Respiratory Cough?: No Shortness of breath?: No  Endocrine Excessive thirst?: No  Musculoskeletal Back pain?: No Joint pain?: No  Neurological Headaches?: No Dizziness?: No  Psychologic Depression?: No Anxiety?: No  Physical Exam: BP 137/82 (BP Location: Left Arm, Patient Position: Sitting)   Pulse 71   Wt 127 lb 3.2 oz (57.7 kg)   BMI 22.53 kg/m   Constitutional:  Alert and oriented, No acute distress. HEENT: [place] AT, moist mucus membranes.  Trachea midline, no masses.  Cardiovascular: No clubbing, cyanosis, or edema.Trace lower extremity edema  Respiratory: Normal respiratory effort, no increased work of breathing.  GI: Abdomen is soft, nontender, nondistended, no abdominal masses GU: No CVA tenderness Skin: No rashes, bruises or suspicious lesions. Neurologic: Grossly intact, no focal deficits, moving all 4 extremities.  Psychiatric: Normal mood and affect.   Laboratory Data: Creatinine 0.8 on 04/2018  Urinalysis UA today negative.   Pertinent Imaging: CLINICAL DATA:  Generalized abdominal pain for several months. Unintentional weight loss. Chronic hepatitis-C.  EXAM: CT ABDOMEN AND PELVIS WITH CONTRAST  TECHNIQUE: Multidetector CT imaging of the abdomen and pelvis was performed using the standard protocol following bolus administration of intravenous contrast.  CONTRAST:  120mL ISOVUE-300 IOPAMIDOL (ISOVUE-300) INJECTION 61%  COMPARISON:  None.  FINDINGS: Lower Chest: No acute findings.  Hepatobiliary: No hepatic masses identified.  Gallbladder is unremarkable.  Pancreas:  No mass or inflammatory changes.  Spleen: Within normal limits in size and appearance.  Adrenals/Urinary Tract: No masses identified. Small left renal cyst noted. Moderate right hydronephrosis is seen due to a 5 mm calculus in the proximal right ureter.  Stomach/Bowel: No evidence of obstruction, inflammatory process or abnormal fluid collections. Normal appendix visualized.  Vascular/Lymphatic: No pathologically enlarged lymph nodes. No abdominal aortic aneurysm. Aortic atherosclerosis.  Reproductive:  No mass or other significant abnormality.  Other:  None.  Musculoskeletal:  No suspicious bone lesions identified.  IMPRESSION: Moderate right hydronephrosis due to 5 mm calculus in proximal right ureter.  These results will be called to the ordering clinician or representative by the Radiologist Assistant, and communication documented in the PACS or zVision Dashboard.   Electronically Signed   By: Earle Gell M.D.   On: 06/05/2018 12:31  I have reviewed CT independently and with pt. Noted moderate 5 mm calculus in proximal right ureter.   Assessment & Plan:    1. Right Hydronephrosis  CT from 06/05/2018 showed moderate right hydronephrosis due to 5 mm calculus in proximal right ureter.  2. Right ureteral calculus Asymptomatic right ureteral calculus, incidental on CT scan last month  Plan for KUB today to assess whether or not the stone can be visualized.  If not, we will plan on following up with a renal ultrasound to see if her hydronephrosis is resolved.  If it does not, we can assume that the stone is retained and given that its been a month since initial diagnosis, would recommend pursuing surgical intervention at that point given failure to pass spontaneously.  In the interim, she was advised to go the emergency room if she experiences any fevers, chills, UTI type symptoms or flank pain that cannot be  controlled with oral medication.  She was advised to look out for the stone when she voids.  We discussed various treatment options including ESWL vs. ureteroscopy, laser lithotripsy, and stent. We discussed the risks and benefits of both including bleeding, infection, damage to surrounding structures, efficacy with need for possible further intervention, and need for temporary ureteral stent.  Return for KUB today f/u with results .  [facility] [address] [place], [place] [address] [phone]  I, Lucas Mallow, am acting as a scribe for Dr. Hollice Espy,  I have reviewed the above documentation for accuracy and completeness, and I agree with the above.   Hollice Espy, MD

## 2018-06-29 ENCOUNTER — Encounter: Payer: Self-pay | Admitting: Urology

## 2018-06-29 ENCOUNTER — Ambulatory Visit
Admission: RE | Admit: 2018-06-29 | Discharge: 2018-06-29 | Disposition: A | Discharge status: Home / Self Care | Payer: Medicare Other | Source: Ambulatory Visit | Attending: Urology | Admitting: Urology

## 2018-06-29 ENCOUNTER — Ambulatory Visit (INDEPENDENT_AMBULATORY_CARE_PROVIDER_SITE_OTHER): Payer: Medicare Other | Admitting: Urology

## 2018-06-29 VITALS — BP 137/82 | HR 71 | Wt 127.2 lb

## 2018-06-29 DIAGNOSIS — N201 Calculus of ureter: Secondary | ICD-10-CM | POA: Diagnosis not present

## 2018-06-29 DIAGNOSIS — N1330 Unspecified hydronephrosis: Secondary | ICD-10-CM | POA: Insufficient documentation

## 2018-06-29 LAB — URINALYSIS, COMPLETE
BILIRUBIN UA: NEGATIVE
GLUCOSE, UA: NEGATIVE
KETONES UA: NEGATIVE
LEUKOCYTES UA: NEGATIVE
Nitrite, UA: NEGATIVE
PROTEIN UA: NEGATIVE
SPEC GRAV UA: 1.02 (ref 1.005–1.030)
UUROB: 0.2 mg/dL (ref 0.2–1.0)
pH, UA: 5.5 (ref 5.0–7.5)

## 2018-06-29 LAB — MICROSCOPIC EXAMINATION
BACTERIA UA: NONE SEEN
Epithelial Cells (non renal): NONE SEEN /hpf (ref 0–10)
WBC UA: NONE SEEN /HPF (ref 0–5)

## 2018-06-30 ENCOUNTER — Telehealth: Payer: Self-pay | Admitting: Urology

## 2018-06-30 DIAGNOSIS — N201 Calculus of ureter: Secondary | ICD-10-CM

## 2018-06-30 NOTE — Telephone Encounter (Signed)
Patient notified and voiced understanding.

## 2018-06-30 NOTE — Telephone Encounter (Signed)
Please let Ms. Forry know that were not able to see the stone on x-ray.  I like to follow-up with a renal ultrasound in the near future.  I have placed this order.  Please schedule.    I will call her when I get these results and will decide if and when she need surgical intervention.    In the interim, she has severe flank pain that cannot be controlled or fevers, she needs go the emergency room.  Hollice Espy, MD

## 2018-07-19 ENCOUNTER — Ambulatory Visit
Admission: RE | Admit: 2018-07-19 | Discharge: 2018-07-19 | Disposition: A | Discharge status: Home / Self Care | Payer: Medicare Other | Source: Ambulatory Visit | Attending: Urology | Admitting: Urology

## 2018-07-19 DIAGNOSIS — N201 Calculus of ureter: Secondary | ICD-10-CM | POA: Insufficient documentation

## 2018-09-19 ENCOUNTER — Telehealth: Payer: Self-pay

## 2018-09-19 NOTE — Telephone Encounter (Signed)
Called and advised patient of normal RUS results per Dr Erlene Quan. Patient verbalized thanks and understanding.

## 2018-09-19 NOTE — Telephone Encounter (Signed)
-----   Message from Hollice Espy, MD sent at 09/19/2018  3:52 PM EDT ----- Cleaning our inbasket.  I cannot tell if patient was ever made aware of her normal renal ultrasound?  Please follow up with her to insure.  Hollice Espy, MD

## 2019-03-19 ENCOUNTER — Other Ambulatory Visit: Payer: Self-pay

## 2019-03-19 DIAGNOSIS — Z20822 Contact with and (suspected) exposure to covid-19: Secondary | ICD-10-CM

## 2019-03-19 NOTE — Progress Notes (Unsigned)
[id number]

## 2019-03-20 LAB — NOVEL CORONAVIRUS, NAA: SARS-CoV-2, NAA: NOT DETECTED

## 2019-06-01 ENCOUNTER — Telehealth: Payer: Self-pay | Admitting: *Deleted

## 2019-06-01 NOTE — Telephone Encounter (Signed)
Contacted regarding referral for lung screening scan. Patient reports smoking history as 1/2 pack per day for 46 years and currently less than 1/4 pack per day. Patient is made aware that she does not currently meet the required 30 pack year history required to be considered "high risk".

## 2019-07-12 ENCOUNTER — Other Ambulatory Visit: Payer: Self-pay | Admitting: Family Medicine

## 2019-07-12 DIAGNOSIS — Z1231 Encounter for screening mammogram for malignant neoplasm of breast: Secondary | ICD-10-CM

## 2019-08-01 ENCOUNTER — Other Ambulatory Visit: Payer: Self-pay

## 2019-08-01 ENCOUNTER — Ambulatory Visit
Admission: RE | Admit: 2019-08-01 | Discharge: 2019-08-01 | Disposition: A | Discharge status: Home / Self Care | Payer: Medicare Other | Source: Ambulatory Visit | Attending: Family Medicine | Admitting: Family Medicine

## 2019-08-01 DIAGNOSIS — Z1231 Encounter for screening mammogram for malignant neoplasm of breast: Secondary | ICD-10-CM | POA: Diagnosis present

## 2019-09-01 ENCOUNTER — Ambulatory Visit: Payer: Medicare Other

## 2019-10-05 IMAGING — US US THYROID
1 series · 14 of 22 positions shown · non-contrast
Comparison: 11/12/2014, 04/24/2013

CLINICAL DATA: 62-year-old female with a history thyroid disease.

Prior treatment with iodine radionucleotide ablation
EXAM:
THYROID ULTRASOUND
TECHNIQUE: Ultrasound examination of the thyroid gland and adjacent soft
tissues was performed.

[Series 1: us thyroid · 0.07mm/px · 14 of 22 slices shown]
[im 1/22]
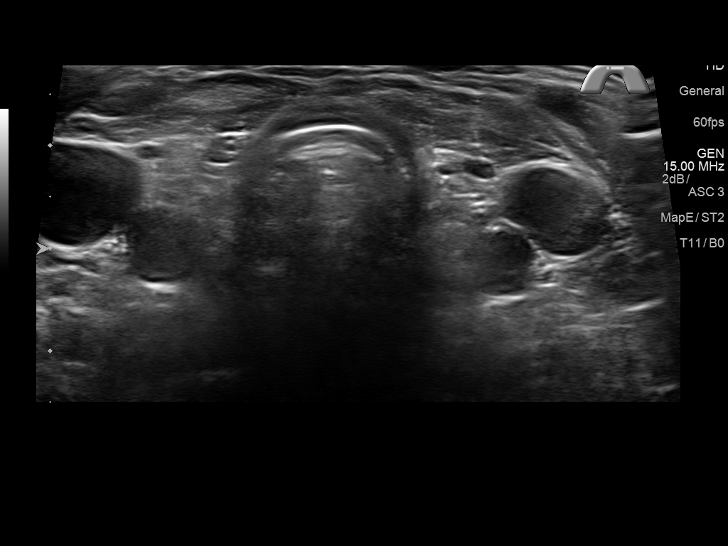
[im 3/22]
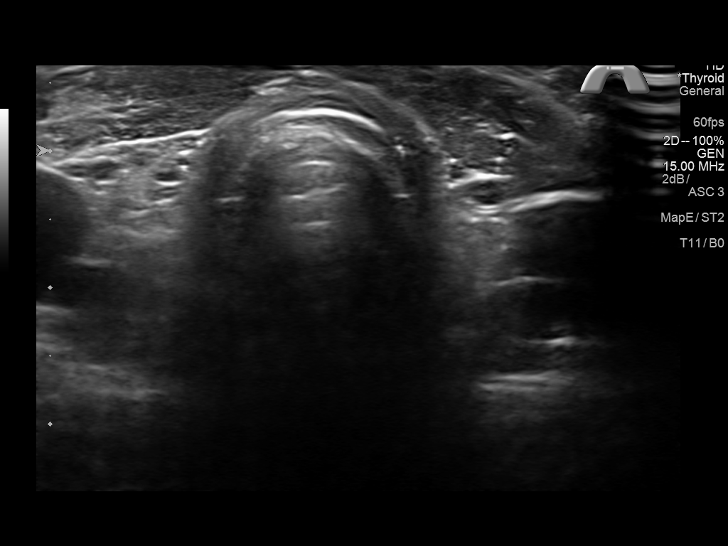
[im 4/22]
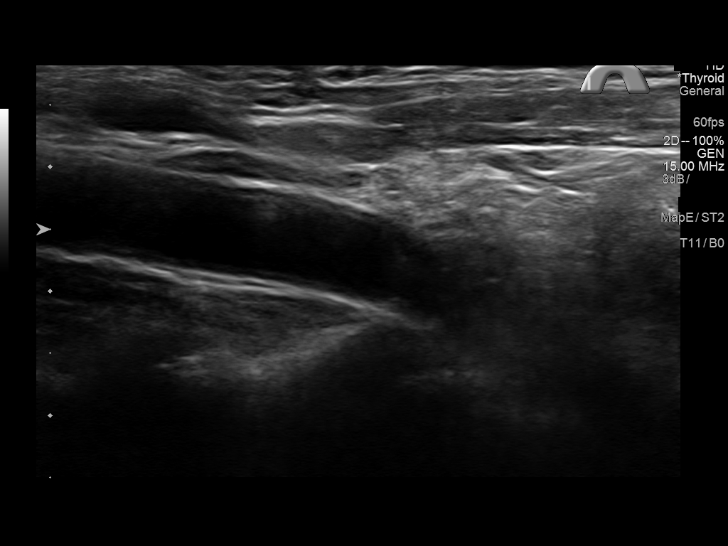
[im 6/22]
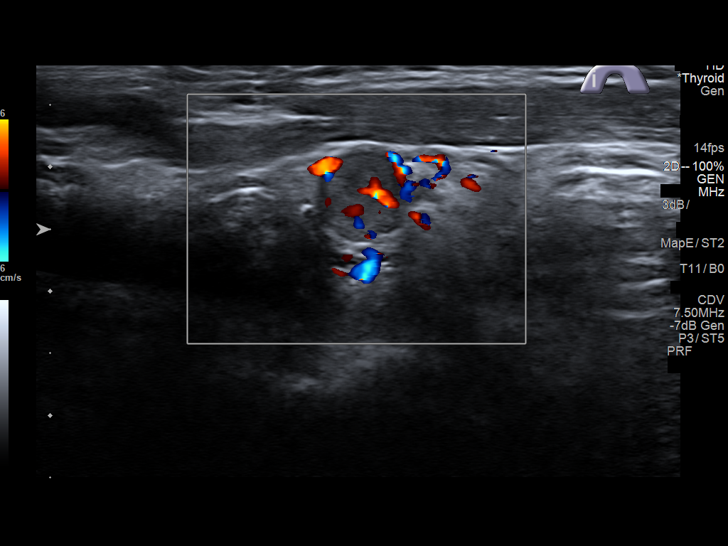
[im 8/22]
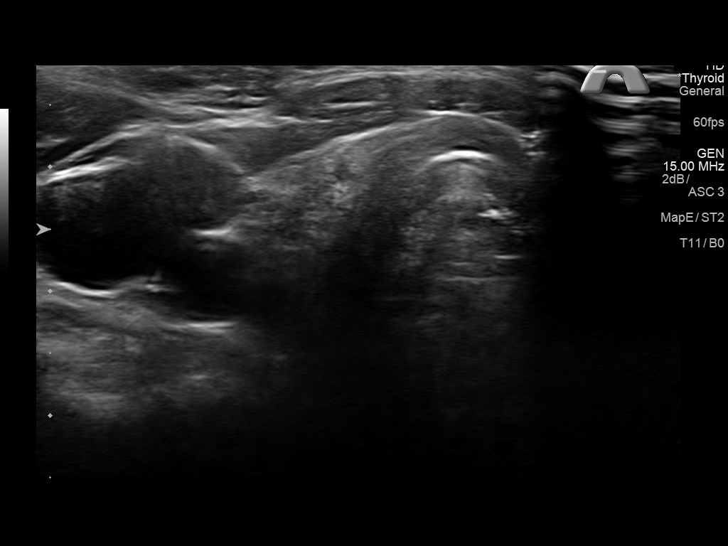
[im 9/22]
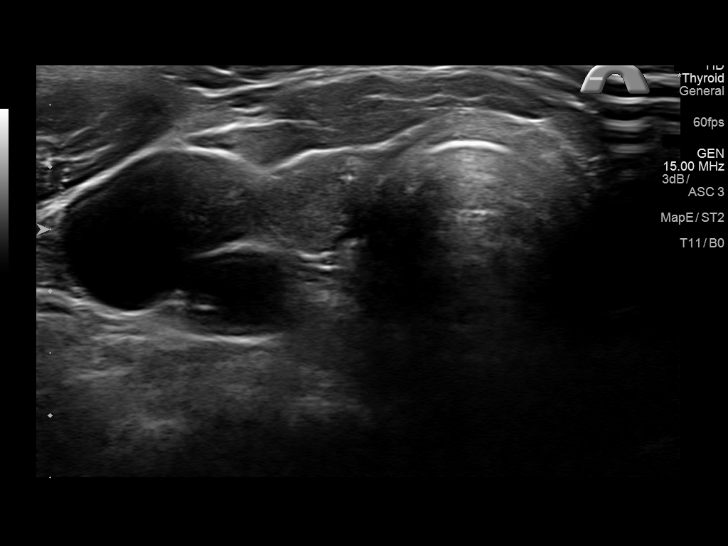
[im 11/22]
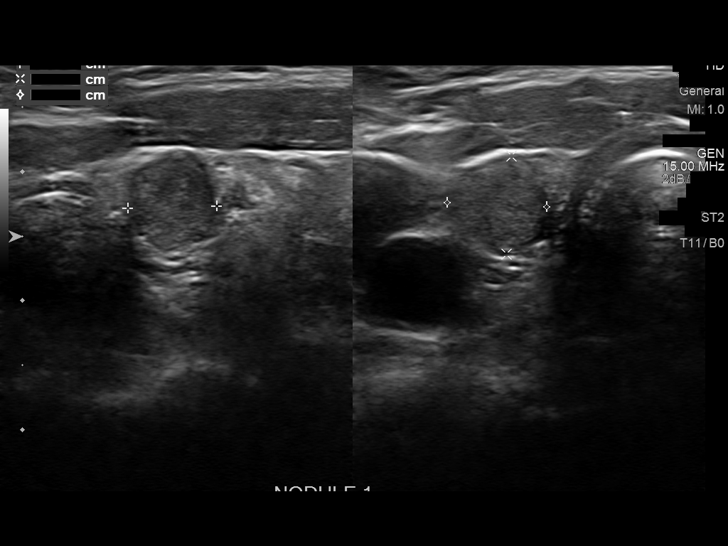
[im 12/22]
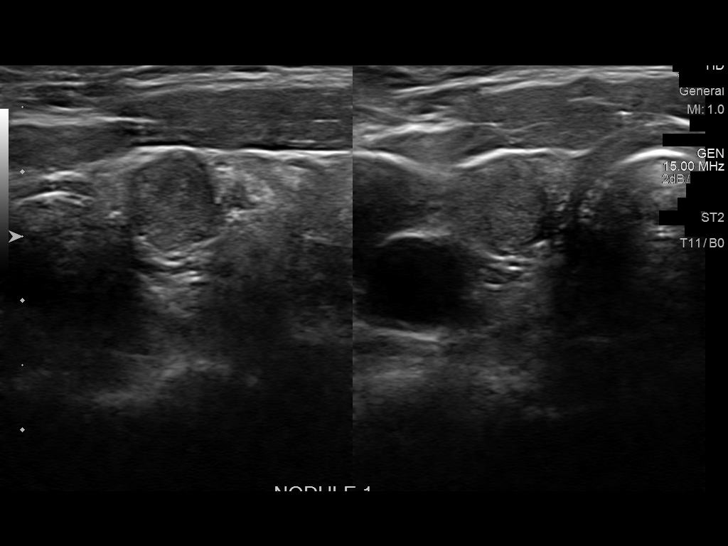
[im 14/22]
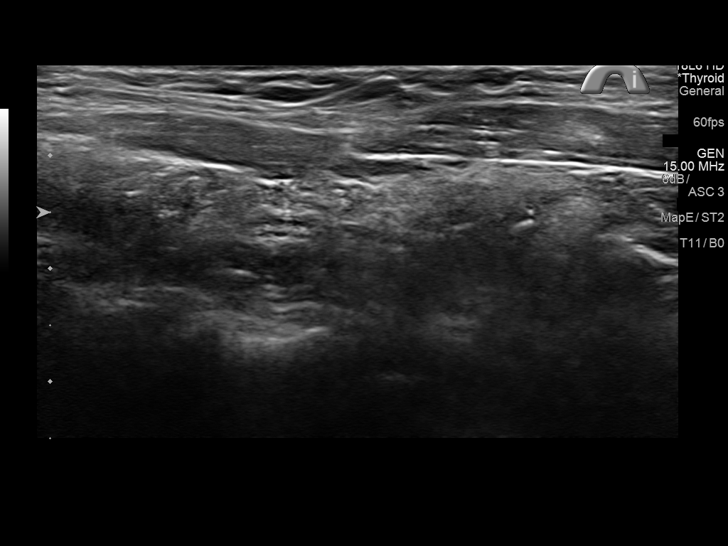
[im 15/22]
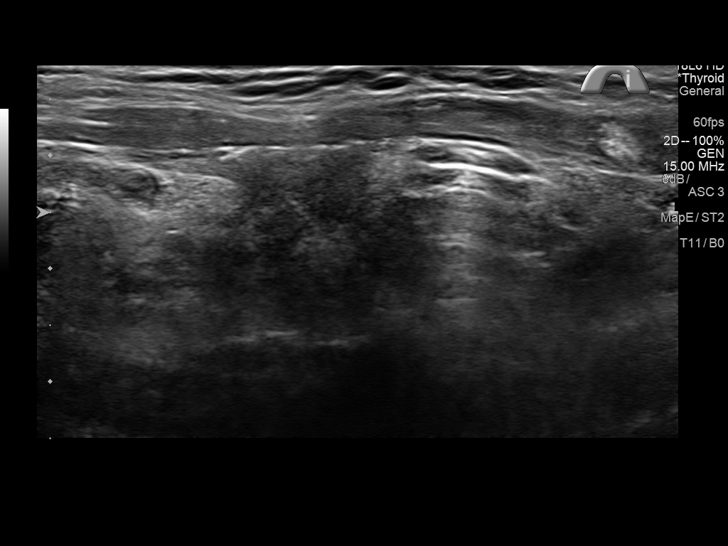
[im 17/22]
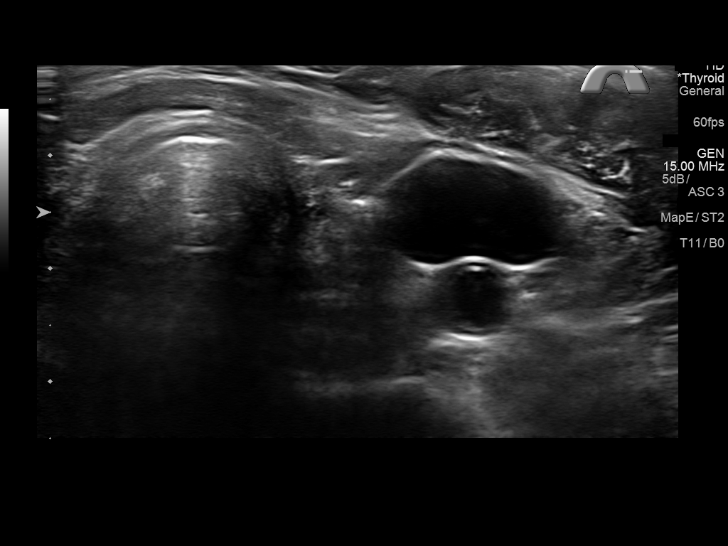
[im 19/22]
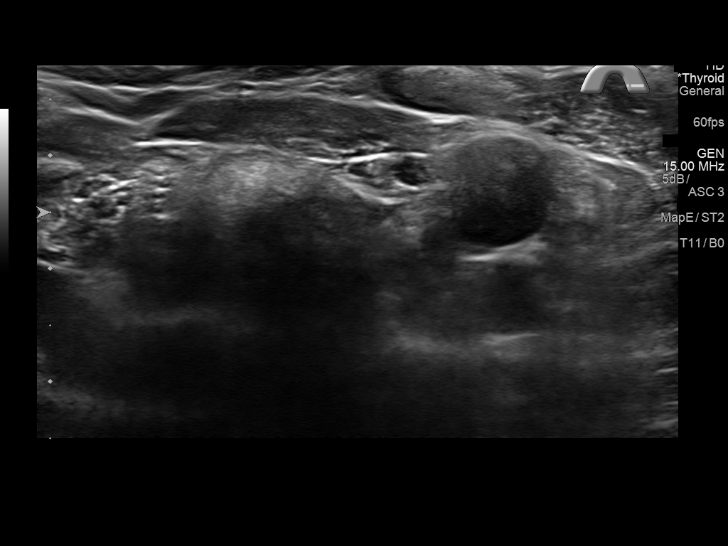
[im 20/22]
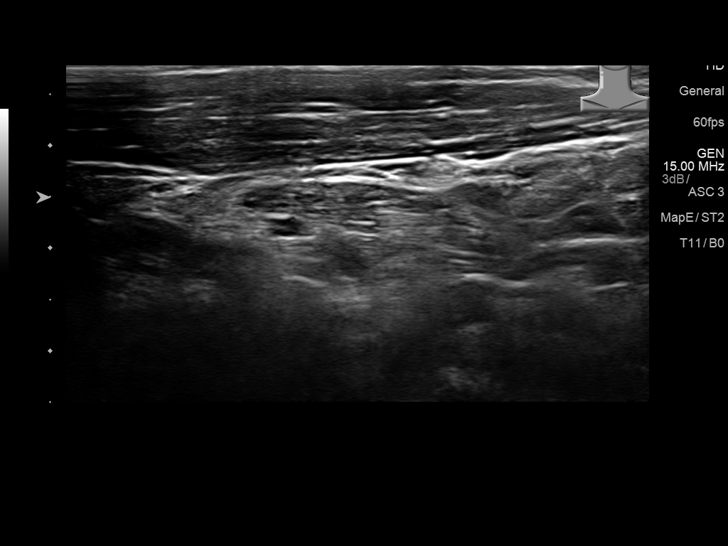
[im 22/22]
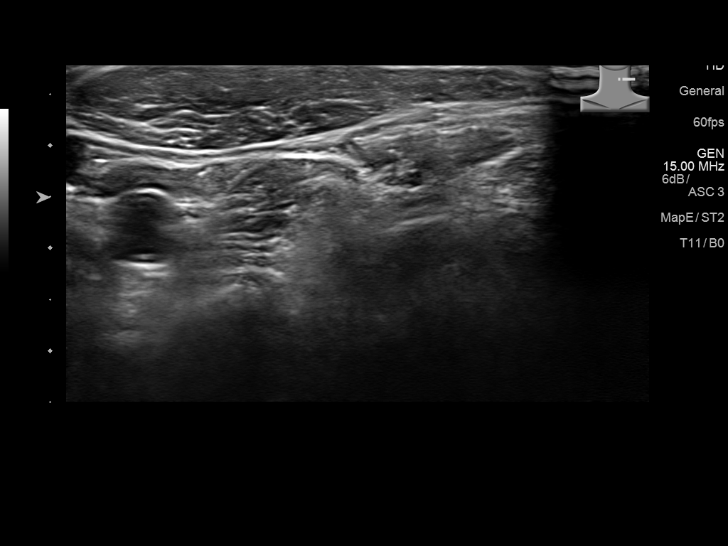

[14 of 22 positions shown; findings below may reference images not displayed]

FINDINGS: No measures will thyroid tissue

_________________________________________________________

Similar appearance of rounded hypoechoic soft tissue within the
right thyroid bed measuring 7 mm x 8 mm x 8 mm. This is unchanged
when comparing to the ultrasound 11/12/2014.

No adenopathy
IMPRESSION: Redemonstration of changes status post radio nuclei thyroid
ablation.

Unchanged 8 mm soft tissue nodule within the right thyroid bed.

## 2020-06-20 ENCOUNTER — Other Ambulatory Visit
Admission: RE | Admit: 2020-06-20 | Discharge: 2020-06-20 | Disposition: A | Discharge status: Home / Self Care | Payer: Medicare Other | Source: Ambulatory Visit | Attending: Gastroenterology | Admitting: Gastroenterology

## 2020-06-20 DIAGNOSIS — Z01812 Encounter for preprocedural laboratory examination: Secondary | ICD-10-CM | POA: Insufficient documentation

## 2020-06-20 DIAGNOSIS — U071 COVID-19: Secondary | ICD-10-CM | POA: Diagnosis not present

## 2020-06-21 LAB — SARS CORONAVIRUS 2 (TAT 6-24 HRS): SARS Coronavirus 2: POSITIVE — AB

## 2020-06-24 ENCOUNTER — Encounter: Admission: RE | Payer: Self-pay | Source: Home / Self Care

## 2020-06-24 ENCOUNTER — Ambulatory Visit: Admission: RE | Admit: 2020-06-24 | Payer: Medicare Other | Source: Home / Self Care

## 2020-06-24 SURGERY — COLONOSCOPY
Anesthesia: General

## 2020-08-08 ENCOUNTER — Inpatient Hospital Stay: Admission: RE | Admit: 2020-08-08 | Payer: 59 | Source: Ambulatory Visit

## 2020-08-12 ENCOUNTER — Ambulatory Visit: Payer: 59 | Admitting: Anesthesiology

## 2020-08-12 ENCOUNTER — Other Ambulatory Visit: Payer: Self-pay

## 2020-08-12 ENCOUNTER — Encounter
Admission: RE | Disposition: A | Discharge status: Home / Self Care | Payer: Self-pay | Source: Home / Self Care | Attending: Gastroenterology

## 2020-08-12 ENCOUNTER — Ambulatory Visit
Admission: RE | Admit: 2020-08-12 | Discharge: 2020-08-12 | Disposition: A | Discharge status: Home / Self Care | Payer: 59 | Attending: Gastroenterology | Admitting: Gastroenterology

## 2020-08-12 ENCOUNTER — Encounter: Payer: Self-pay | Admitting: *Deleted

## 2020-08-12 DIAGNOSIS — Z1211 Encounter for screening for malignant neoplasm of colon: Secondary | ICD-10-CM | POA: Diagnosis not present

## 2020-08-12 DIAGNOSIS — Z79890 Hormone replacement therapy (postmenopausal): Secondary | ICD-10-CM | POA: Diagnosis not present

## 2020-08-12 DIAGNOSIS — F319 Bipolar disorder, unspecified: Secondary | ICD-10-CM | POA: Insufficient documentation

## 2020-08-12 DIAGNOSIS — I10 Essential (primary) hypertension: Secondary | ICD-10-CM | POA: Insufficient documentation

## 2020-08-12 DIAGNOSIS — D124 Benign neoplasm of descending colon: Secondary | ICD-10-CM | POA: Diagnosis present

## 2020-08-12 DIAGNOSIS — Z7982 Long term (current) use of aspirin: Secondary | ICD-10-CM | POA: Diagnosis not present

## 2020-08-12 DIAGNOSIS — Z79899 Other long term (current) drug therapy: Secondary | ICD-10-CM | POA: Diagnosis not present

## 2020-08-12 DIAGNOSIS — E039 Hypothyroidism, unspecified: Secondary | ICD-10-CM | POA: Insufficient documentation

## 2020-08-12 DIAGNOSIS — J449 Chronic obstructive pulmonary disease, unspecified: Secondary | ICD-10-CM | POA: Insufficient documentation

## 2020-08-12 DIAGNOSIS — Z86010 Personal history of colonic polyps: Secondary | ICD-10-CM | POA: Diagnosis not present

## 2020-08-12 DIAGNOSIS — Z8616 Personal history of COVID-19: Secondary | ICD-10-CM | POA: Diagnosis not present

## 2020-08-12 HISTORY — DX: Cardiac murmur, unspecified: R01.1

## 2020-08-12 HISTORY — DX: Unspecified viral hepatitis C without hepatic coma: B19.20

## 2020-08-12 HISTORY — DX: Opioid abuse, uncomplicated: F11.10

## 2020-08-12 HISTORY — PX: COLONOSCOPY WITH PROPOFOL: SHX5780

## 2020-08-12 HISTORY — DX: Chronic obstructive pulmonary disease, unspecified: J44.9

## 2020-08-12 HISTORY — DX: Benign neoplasm of colon, unspecified: D12.6

## 2020-08-12 HISTORY — DX: Low grade squamous intraepithelial lesion on cytologic smear of cervix (LGSIL): R87.612

## 2020-08-12 SURGERY — COLONOSCOPY WITH PROPOFOL
Anesthesia: General

## 2020-08-12 MED ORDER — LIDOCAINE HCL (CARDIAC) PF 100 MG/5ML IV SOSY
PREFILLED_SYRINGE | INTRAVENOUS | Status: DC | PRN
  Administered 2020-08-12: 60 mg via INTRATRACHEAL

## 2020-08-12 MED ORDER — PROPOFOL 500 MG/50ML IV EMUL
INTRAVENOUS | Status: DC | PRN
  Administered 2020-08-12: 150 ug/kg/min via INTRAVENOUS

## 2020-08-12 MED ORDER — SODIUM CHLORIDE 0.9 % IV SOLN: INTRAVENOUS | Status: DC

## 2020-08-12 MED ORDER — PROPOFOL 10 MG/ML IV BOLUS
INTRAVENOUS | Status: DC | PRN
  Administered 2020-08-12 (×2): 20 mg via INTRAVENOUS
  Administered 2020-08-12: 50 mg via INTRAVENOUS
  Administered 2020-08-12: 20 mg via INTRAVENOUS

## 2020-08-12 NOTE — Op Note (Signed)
[facility] Gastroenterology Patient Name: Connie Wright Procedure Date: 08/12/2020 2:01 PM MRN: [id number] Account #: [id number] Date of Birth: 1956-05-26 Admit Type: Outpatient Age: 65 Room: [facility] ENDO ROOM 1 Gender: Female Note Status: Finalized Procedure:             Colonoscopy Indications:           High risk colon cancer surveillance: Personal history                         of colonic polyps Providers:             Andrey Farmer MD, MD Referring MD:          Gayland Curry MD, MD (Referring MD) Medicines:             Monitored Anesthesia Care Complications:         No immediate complications. Estimated blood loss:                         Minimal. Procedure:             Pre-Anesthesia Assessment:                        - Prior to the procedure, a History and Physical was                         performed, and patient medications and allergies were                         reviewed. The patient is competent. The risks and                         benefits of the procedure and the sedation options and                         risks were discussed with the patient. All questions                         were answered and informed consent was obtained.                         Patient identification and proposed procedure were                         verified by the physician, the nurse, the anesthetist                         and the technician in the endoscopy suite. Mental                         Status Examination: alert and oriented. Airway                         Examination: normal oropharyngeal airway and neck                         mobility. Respiratory Examination: clear to  auscultation. CV Examination: normal. Prophylactic                         Antibiotics: The patient does not require prophylactic                         antibiotics. Prior Anticoagulants: The patient has                         taken no previous anticoagulant  or antiplatelet                         agents. ASA Grade Assessment: II - A patient with mild                         systemic disease. After reviewing the risks and                         benefits, the patient was deemed in satisfactory                         condition to undergo the procedure. The anesthesia                         plan was to use monitored anesthesia care (MAC).                         Immediately prior to administration of medications,                         the patient was re-assessed for adequacy to receive                         sedatives. The heart rate, respiratory rate, oxygen                         saturations, blood pressure, adequacy of pulmonary                         ventilation, and response to care were monitored                         throughout the procedure. The physical status of the                         patient was re-assessed after the procedure.                        After obtaining informed consent, the colonoscope was                         passed under direct vision. Throughout the procedure,                         the patient's blood pressure, pulse, and oxygen                         saturations were monitored continuously. The  Colonoscope was introduced through the anus and                         advanced to the the cecum, identified by appendiceal                         orifice and ileocecal valve. The patient tolerated the                         procedure well. The colonoscopy was somewhat difficult                         due to a tortuous colon. Successful completion of the                         procedure was aided by straightening and shortening                         the scope to obtain bowel loop reduction. The patient                         tolerated the procedure well. The quality of the bowel                         preparation was inadequate. Findings:      The perianal and digital rectal  examinations were normal.      A 2 mm polyp was found in the descending colon. The polyp was sessile.       The polyp was removed with a jumbo cold forceps. Resection and retrieval       were complete. Estimated blood loss was minimal.      The exam was otherwise without abnormality on direct and retroflexion       views. Impression:            - Preparation of the colon was inadequate.                        - One 2 mm polyp in the descending colon, removed with                         a jumbo cold forceps. Resected and retrieved.                        - The examination was otherwise normal on direct and                         retroflexion views. Recommendation:        - Discharge patient to home.                        - Resume previous diet.                        - Continue present medications.                        - Await pathology results.                        -  Repeat colonoscopy in 6-12 months because the bowel                         preparation was suboptimal.                        - Return to referring physician as previously                         scheduled. Procedure Code(s):     --- Professional ---                        [phone], Colonoscopy, flexible; with biopsy, single or                         multiple Diagnosis Code(s):     --- Professional ---                        Z86.010, Personal history of colonic polyps                        K63.5, Polyp of colon CPT copyright 2019 American Medical Association. All rights reserved. The codes documented in this report are preliminary and upon coder review may  be revised to meet current compliance requirements. Andrey Farmer MD, MD 08/12/2020 2:40:35 PM Number of Addenda: 0 Note Initiated On: 08/12/2020 2:01 PM Scope Withdrawal Time: 0 hours 7 minutes 22 seconds  Total Procedure Duration: 0 hours 19 minutes 33 seconds  Estimated Blood Loss:  Estimated blood loss was minimal.      [facility]

## 2020-08-12 NOTE — Transfer of Care (Signed)
Immediate Anesthesia Transfer of Care Note  Patient: Connie Wright  Procedure(s) Performed: COLONOSCOPY WITH PROPOFOL (N/A )  Patient Location: PACU  Anesthesia Type:General  Level of Consciousness: sedated  Airway & Oxygen Therapy: Patient Spontanous Breathing and Patient connected to nasal cannula oxygen  Post-op Assessment: Report given to RN and Post -op Vital signs reviewed and stable  Post vital signs: Reviewed and stable  Last Vitals:  Vitals Value Taken Time  BP 125/71 08/12/20 1440  Temp 35.7 C 08/12/20 1439  Pulse 59 08/12/20 1442  Resp 18 08/12/20 1442  SpO2 100 % 08/12/20 1442  Vitals shown include unvalidated device data.  Last Pain:  Vitals:   08/12/20 1439  TempSrc: Temporal  PainSc: Asleep         Complications: No complications documented.

## 2020-08-12 NOTE — Anesthesia Preprocedure Evaluation (Signed)
Anesthesia Evaluation  Patient identified by MRN, date of birth, ID band Patient awake    Reviewed: Allergy & Precautions, H&P , NPO status , Patient's Chart, lab work & pertinent test results  History of Anesthesia Complications Negative for: history of anesthetic complications  Airway Mallampati: III  TM Distance: >3 FB Neck ROM: limited    Dental  (+) Upper Dentures, Lower Dentures   Pulmonary COPD, Current Smoker,    Pulmonary exam normal        Cardiovascular Exercise Tolerance: Good hypertension, (-) anginaNormal cardiovascular exam     Neuro/Psych PSYCHIATRIC DISORDERS negative neurological ROS     GI/Hepatic negative GI ROS, (+) Hepatitis -  Endo/Other  negative endocrine ROS  Renal/GU negative Renal ROS  negative genitourinary   Musculoskeletal   Abdominal   Peds  Hematology negative hematology ROS (+)   Anesthesia Other Findings Past Medical History: No date: Adenomatous colon polyp No date: Bipolar disorder ([facility]) No date: COPD (chronic obstructive pulmonary disease) ([facility]) No date: Depression No date: HCV (hepatitis C virus) No date: Heart murmur No date: Hep C w/o coma, chronic ([facility]) No date: Hepatitis C No date: Hepatitis C No date: Hyperlipidemia No date: Hypertension No date: LGSIL of cervix of undetermined significance No date: Liver disease No date: Opioid abuse ([place]) No date: Post-menopause No date: Schizophrenia ([place]) No date: Syphilis No date: Syphilis No date: Thyroid disease  Past Surgical History: No date: CESAREAN SECTION No date: COLONOSCOPY No date: radioactive iodine thyroid ablation  BMI    Body Mass Index: 22.14 kg/m      Reproductive/Obstetrics negative OB ROS                             Anesthesia Physical Anesthesia Plan  ASA: III  Anesthesia Plan: General   Post-op Pain Management:    Induction: Intravenous  PONV Risk Score  and Plan: Propofol infusion and TIVA  Airway Management Planned: Natural Airway and Nasal Cannula  Additional Equipment:   Intra-op Plan:   Post-operative Plan:   Informed Consent: I have reviewed the patients History and Physical, chart, labs and discussed the procedure including the risks, benefits and alternatives for the proposed anesthesia with the patient or authorized representative who has indicated his/her understanding and acceptance.     Dental Advisory Given  Plan Discussed with: Anesthesiologist, CRNA and Surgeon  Anesthesia Plan Comments: (Patient consented for risks of anesthesia including but not limited to:  - adverse reactions to medications - risk of airway placement if required - damage to eyes, teeth, lips or other oral mucosa - nerve damage due to positioning  - sore throat or hoarseness - Damage to heart, brain, nerves, lungs, other parts of body or loss of life  Patient voiced understanding.)        Anesthesia Quick Evaluation

## 2020-08-12 NOTE — Interval H&P Note (Signed)
History and Physical Interval Note:  08/12/2020 2:05 PM  Connie Wright  has presented today for surgery, with the diagnosis of P HX POLYPS.  The various methods of treatment have been discussed with the patient and family. After consideration of risks, benefits and other options for treatment, the patient has consented to  Procedure(s) with comments: COLONOSCOPY WITH PROPOFOL (N/A) - COVID POSITIVE 06/20/2020 as a surgical intervention.  The patient's history has been reviewed, patient examined, no change in status, stable for surgery.  I have reviewed the patient's chart and labs.  Questions were answered to the patient's satisfaction.     Lesly Rubenstein  Ok to proceed with colonoscopy

## 2020-08-12 NOTE — Anesthesia Postprocedure Evaluation (Signed)
Anesthesia Post Note  Patient: Connie Wright  Procedure(s) Performed: COLONOSCOPY WITH PROPOFOL (N/A )  Patient location during evaluation: Endoscopy Anesthesia Type: General Level of consciousness: awake and alert Pain management: pain level controlled Vital Signs Assessment: post-procedure vital signs reviewed and stable Respiratory status: spontaneous breathing, nonlabored ventilation, respiratory function stable and patient connected to nasal cannula oxygen Cardiovascular status: blood pressure returned to baseline and stable Postop Assessment: no apparent nausea or vomiting Anesthetic complications: no   No complications documented.   Last Vitals:  Vitals:   08/12/20 1449 08/12/20 1459  BP: 136/81 (!) 177/95  Pulse: 73 63  Resp: 14 16  Temp:    SpO2: 100% 100%    Last Pain:  Vitals:   08/12/20 1459  TempSrc:   PainSc: 0-No pain                 Precious Haws Esme Freund

## 2020-08-12 NOTE — Anesthesia Procedure Notes (Signed)
Date/Time: 08/12/2020 2:00 PM Performed by: Allean Found, CRNA Pre-anesthesia Checklist: Patient identified, Emergency Drugs available, Suction available, Patient being monitored and Timeout performed Patient Re-evaluated:Patient Re-evaluated prior to induction Oxygen Delivery Method: Nasal cannula Induction Type: IV induction Placement Confirmation: positive ETCO2

## 2020-08-12 NOTE — H&P (Signed)
Outpatient short stay form Pre-procedure 08/12/2020 2:02 PM Connie Miyamoto MD, MPH  Primary Physician: Dr. Astrid Divine  Reason for visit:  Surveillance colonoscopy  History of present illness:   65 y/o lady with history of hep c s/p SVR,hypothyroidism, and hypertension here for surveillance colonoscopy due to history of polyps. No blood thinners, significant abdominal surgeries, or family history of GI malignancies.    Current Facility-Administered Medications:    0.9 %  sodium chloride infusion, , Intravenous, Continuous, Rayshawn Visconti, Connie Cork, MD, Last Rate: 20 mL/hr at 08/12/20 1332, New Bag at 08/12/20 1332  Medications Prior to Admission  Medication Sig Dispense Refill Last Dose   atorvastatin (LIPITOR) 20 MG tablet Take 20 mg by mouth daily.   Past Week at Unknown time   carvedilol (COREG) 25 MG tablet Take by mouth.   08/12/2020 at 0600   carvedilol (COREG) 25 MG tablet Take 25 mg by mouth 2 (two) times daily with a meal.   Past Week at Unknown time   furosemide (LASIX) 20 MG tablet Take by mouth.   Past Week at Unknown time   lactulose (CHRONULAC) 10 GM/15ML solution Take by mouth.   Past Week at Unknown time   levothyroxine (SYNTHROID) 88 MCG tablet Take 88 mcg by mouth daily before breakfast. SUN/MON/TUES    at 0730   losartan (COZAAR) 50 MG tablet Take 50 mg by mouth daily.   08/11/2020 at Unknown time   metroNIDAZOLE (FLAGYL) 500 MG tablet    Past Week at Unknown time   mirtazapine (REMERON) 15 MG tablet Take 15 mg by mouth at bedtime.   Past Week at Unknown time   Multiple Vitamin (MULTIVITAMIN) tablet Take 1 tablet by mouth daily.      aspirin 81 MG chewable tablet Chew by mouth.      atorvastatin (LIPITOR) 10 MG tablet Take by mouth.      Cholecalciferol (VITAMIN D3) 400 units tablet Take 1 tablet (400 Units total) by mouth daily. 30 tablet 2    Cholecalciferol 50 MCG (2000 UT) TABS Take by mouth.      lactulose, encephalopathy, (CHRONULAC) 10 GM/15ML SOLN Take  by mouth.      levothyroxine (SYNTHROID) 75 MCG tablet Take 75 mcg by mouth daily before breakfast. WED/THURS/FRI/SAT      levothyroxine (SYNTHROID, LEVOTHROID) 88 MCG tablet Take by mouth.      mirtazapine (REMERON) 15 MG tablet Take by mouth.      QUEtiapine (SEROQUEL) 300 MG tablet Take 2 tablets (600 mg total) by mouth at bedtime. 180 tablet 3    triamcinolone cream (KENALOG) 0.1 % Apply to affected areas on legs daily 2 weeks on and off        No Known Allergies   Past Medical History:  Diagnosis Date   Adenomatous colon polyp    Bipolar disorder (HCC)    COPD (chronic obstructive pulmonary disease) (HCC)    Depression    HCV (hepatitis C virus)    Heart murmur    Hep C w/o coma, chronic (HCC)    Hepatitis C    Hepatitis C    Hyperlipidemia    Hypertension    LGSIL of cervix of undetermined significance    Liver disease    Opioid abuse ([place])    Post-menopause    Schizophrenia ([place])    Syphilis    Syphilis    Thyroid disease     Review of systems:  Otherwise negative.    Physical Exam  Gen: Alert, oriented.  Appears stated age.  HEENT: PERRLA. Lungs: No respiratory distress CV: RRR Abd: soft, benign, no masses Ext: No edema    Planned procedures: Proceed with colonoscopy. The patient understands the nature of the planned procedure, indications, risks, alternatives and potential complications including but not limited to bleeding, infection, perforation, damage to internal organs and possible oversedation/side effects from anesthesia. The patient agrees and gives consent to proceed.  Please refer to procedure notes for findings, recommendations and patient disposition/instructions.     Connie Miyamoto MD, MPH Gastroenterology 08/12/2020  2:02 PM

## 2020-08-14 ENCOUNTER — Encounter: Payer: Self-pay | Admitting: Gastroenterology

## 2020-08-14 LAB — SURGICAL PATHOLOGY

## 2020-09-10 ENCOUNTER — Other Ambulatory Visit: Payer: Self-pay | Admitting: Family Medicine

## 2020-09-10 DIAGNOSIS — Z1231 Encounter for screening mammogram for malignant neoplasm of breast: Secondary | ICD-10-CM

## 2020-09-17 ENCOUNTER — Ambulatory Visit
Admission: RE | Admit: 2020-09-17 | Discharge: 2020-09-17 | Disposition: A | Discharge status: Home / Self Care | Payer: 59 | Source: Ambulatory Visit | Attending: Family Medicine | Admitting: Family Medicine

## 2020-09-17 ENCOUNTER — Other Ambulatory Visit: Payer: Self-pay

## 2020-09-17 DIAGNOSIS — Z1231 Encounter for screening mammogram for malignant neoplasm of breast: Secondary | ICD-10-CM | POA: Diagnosis present

## 2021-09-28 ENCOUNTER — Encounter: Payer: Self-pay | Admitting: *Deleted

## 2021-09-29 ENCOUNTER — Ambulatory Visit: Payer: Medicare Other | Admitting: Certified Registered"

## 2021-09-29 ENCOUNTER — Encounter
Admission: RE | Disposition: A | Discharge status: Home / Self Care | Payer: Self-pay | Source: Home / Self Care | Attending: Gastroenterology

## 2021-09-29 ENCOUNTER — Ambulatory Visit
Admission: RE | Admit: 2021-09-29 | Discharge: 2021-09-29 | Disposition: A | Discharge status: Home / Self Care | Payer: Medicare Other | Attending: Gastroenterology | Admitting: Gastroenterology

## 2021-09-29 ENCOUNTER — Encounter: Payer: Self-pay | Admitting: *Deleted

## 2021-09-29 DIAGNOSIS — Z1211 Encounter for screening for malignant neoplasm of colon: Secondary | ICD-10-CM | POA: Insufficient documentation

## 2021-09-29 DIAGNOSIS — I10 Essential (primary) hypertension: Secondary | ICD-10-CM | POA: Diagnosis not present

## 2021-09-29 DIAGNOSIS — Z8619 Personal history of other infectious and parasitic diseases: Secondary | ICD-10-CM | POA: Diagnosis not present

## 2021-09-29 DIAGNOSIS — E039 Hypothyroidism, unspecified: Secondary | ICD-10-CM | POA: Diagnosis not present

## 2021-09-29 DIAGNOSIS — K640 First degree hemorrhoids: Secondary | ICD-10-CM | POA: Diagnosis not present

## 2021-09-29 DIAGNOSIS — Z86010 Personal history of colonic polyps: Secondary | ICD-10-CM | POA: Diagnosis not present

## 2021-09-29 DIAGNOSIS — D122 Benign neoplasm of ascending colon: Secondary | ICD-10-CM | POA: Insufficient documentation

## 2021-09-29 HISTORY — DX: Other specified diseases of blood and blood-forming organs: D75.89

## 2021-09-29 HISTORY — DX: Cardiomyopathy, unspecified: I42.9

## 2021-09-29 HISTORY — PX: COLONOSCOPY WITH PROPOFOL: SHX5780

## 2021-09-29 HISTORY — DX: Clubbing of fingers: R68.3

## 2021-09-29 HISTORY — DX: Peripheral vascular disease, unspecified: I73.9

## 2021-09-29 SURGERY — COLONOSCOPY WITH PROPOFOL
Anesthesia: Monitor Anesthesia Care

## 2021-09-29 MED ORDER — SODIUM CHLORIDE 0.9 % IV SOLN: INTRAVENOUS | Status: DC

## 2021-09-29 MED ORDER — PROPOFOL 10 MG/ML IV BOLUS
INTRAVENOUS | Status: DC | PRN
  Administered 2021-09-29: 30 mg via INTRAVENOUS
  Administered 2021-09-29: 40 mg via INTRAVENOUS
  Administered 2021-09-29: 30 mg via INTRAVENOUS
  Administered 2021-09-29: 40 mg via INTRAVENOUS
  Administered 2021-09-29: 80 mg via INTRAVENOUS

## 2021-09-29 NOTE — Op Note (Signed)
[facility] ?Gastroenterology ?Patient Name: Connie Wright ?Procedure Date: 09/29/2021 9:36 AM ?MRN: [id number] ?Account #: [id number] ?Date of Birth: 14-Nov-1955 ?Admit Type: Outpatient ?Age: 66 ?Room: [facility] ENDO ROOM 1 ?Gender: Female ?Note Status: Finalized ?Instrument Name: Colonscope [id number] ?Procedure:             Colonoscopy ?Indications:           Surveillance: History of adenomatous polyps,  ?                       inadequate prep on last exam (<14yr ?Providers:             CAndrey FarmerMD, MD ?Medicines:             Monitored Anesthesia Care ?Complications:         No immediate complications. Estimated blood loss:  ?                       Minimal. ?Procedure:             Pre-Anesthesia Assessment: ?                       - Prior to the procedure, a History and Physical was  ?                       performed, and patient medications and allergies were  ?                       reviewed. The patient is competent. The risks and  ?                       benefits of the procedure and the sedation options and  ?                       risks were discussed with the patient. All questions  ?                       were answered and informed consent was obtained.  ?                       Patient identification and proposed procedure were  ?                       verified by the physician, the nurse, the  ?                       anesthesiologist, the anesthetist and the technician  ?                       in the endoscopy suite. Mental Status Examination:  ?                       alert and oriented. Airway Examination: normal  ?                       oropharyngeal airway and neck mobility. Respiratory  ?                       Examination: clear to auscultation. CV Examination:  ?  normal. Prophylactic Antibiotics: The patient does not  ?                       require prophylactic antibiotics. Prior  ?                       Anticoagulants: The patient has taken no previous  ?                        anticoagulant or antiplatelet agents. ASA Grade  ?                       Assessment: II - A patient with mild systemic disease.  ?                       After reviewing the risks and benefits, the patient  ?                       was deemed in satisfactory condition to undergo the  ?                       procedure. The anesthesia plan was to use monitored  ?                       anesthesia care (MAC). Immediately prior to  ?                       administration of medications, the patient was  ?                       re-assessed for adequacy to receive sedatives. The  ?                       heart rate, respiratory rate, oxygen saturations,  ?                       blood pressure, adequacy of pulmonary ventilation, and  ?                       response to care were monitored throughout the  ?                       procedure. The physical status of the patient was  ?                       re-assessed after the procedure. ?                       After obtaining informed consent, the colonoscope was  ?                       passed under direct vision. Throughout the procedure,  ?                       the patient's blood pressure, pulse, and oxygen  ?                       saturations were monitored continuously. The  ?  Colonoscope was introduced through the anus and  ?                       advanced to the the cecum, identified by appendiceal  ?                       orifice and ileocecal valve. The colonoscopy was  ?                       performed without difficulty. The patient tolerated  ?                       the procedure well. The quality of the bowel  ?                       preparation was good. ?Findings: ?     The perianal and digital rectal examinations were normal. ?     A 2 mm polyp was found in the ascending colon. The polyp was sessile.  ?     The polyp was removed with a cold snare. Resection and retrieval were  ?     complete. Estimated blood loss was  minimal. ?     Internal hemorrhoids were found during retroflexion. The hemorrhoids  ?     were Grade I (internal hemorrhoids that do not prolapse). ?     The exam was otherwise without abnormality on direct and retroflexion  ?     views. ?Impression:            - One 2 mm polyp in the ascending colon, removed with  ?                       a cold snare. Resected and retrieved. ?                       - Internal hemorrhoids. ?                       - The examination was otherwise normal on direct and  ?                       retroflexion views. ?Recommendation:        - Discharge patient to home. ?                       - Resume previous diet. ?                       - Continue present medications. ?                       - Await pathology results. ?                       - Repeat colonoscopy in 7 years for surveillance. ?                       - Return to referring physician as previously  ?                       scheduled. ?Procedure Code(s):     --- Professional --- ?  45385, Colonoscopy, flexible; with removal of  ?                       tumor(s), polyp(s), or other lesion(s) by snare  ?                       technique ?Diagnosis Code(s):     --- Professional --- ?                       Z86.010, Personal history of colonic polyps ?                       K63.5, Polyp of colon ?                       K64.0, First degree hemorrhoids ?CPT copyright 2019 [employer]. All rights reserved. ?The codes documented in this report are preliminary and upon coder review may  ?be revised to meet current compliance requirements. ?Andrey Farmer MD, MD ?09/29/2021 10:30:06 AM ?Number of Addenda: 0 ?Note Initiated On: 09/29/2021 9:36 AM ?Scope Withdrawal Time: 0 hours 10 minutes 23 seconds  ?Total Procedure Duration: 0 hours 15 minutes 12 seconds  ?Estimated Blood Loss:  Estimated blood loss was minimal. ?     [facility] ?

## 2021-09-29 NOTE — Transfer of Care (Signed)
Immediate Anesthesia Transfer of Care Note ? ?Patient: Connie Wright ? ?Procedure(s) Performed: COLONOSCOPY WITH PROPOFOL ? ?Patient Location: Endoscopy Unit ? ?Anesthesia Type:MAC ? ?Level of Consciousness: awake, alert  and oriented ? ?Airway & Oxygen Therapy: Patient Spontanous Breathing and Patient connected to nasal cannula oxygen ? ?Post-op Assessment: Report given to RN, Post -op Vital signs reviewed and stable and Patient moving all extremities ? ?Post vital signs: Reviewed and stable ? ?Last Vitals:  ?Vitals Value Taken Time  ?BP    ?Temp    ?Pulse    ?Resp 15 09/29/21 1030  ?SpO2    ?Vitals shown include unvalidated device data. ? ?Last Pain:  ?Vitals:  ? 09/29/21 0937  ?TempSrc: Temporal  ?PainSc: 0-No pain  ?   ? ?  ? ?Complications: No notable events documented. ?

## 2021-09-29 NOTE — H&P (Signed)
Outpatient short stay form Pre-procedure ?09/29/2021  ?Lesly Rubenstein, MD ? ?Primary Physician: Gayland Curry, MD ? ?Reason for visit:  Surveillance colonoscopy ? ?History of present illness:   ? ?66 y/o lady with history of hep c s/p  SVR, hypothyrodisim, and hypertension here for surveillance colonoscopy. Last colonoscopy was about 1 year ago with poor prep. No blood thinners, no significant abdominal surgeries, no family history of GI malignancies. ? ? ? ?Current Facility-Administered Medications:  ?  0.9 %  sodium chloride infusion, , Intravenous, Continuous, Evelyna Folker, Hilton Cork, MD, Last Rate: 20 mL/hr at 09/29/21 0958, New Bag at 09/29/21 0958 ? ?Medications Prior to Admission  ?Medication Sig Dispense Refill Last Dose  ? aspirin 81 MG chewable tablet Chew by mouth.   09/28/2021  ? atorvastatin (LIPITOR) 40 MG tablet Take 20 mg by mouth daily.   09/28/2021  ? carvedilol (COREG) 25 MG tablet Take 25 mg by mouth 2 (two) times daily with a meal.   09/28/2021  ? Cholecalciferol (VITAMIN D3) 400 units tablet Take 1 tablet (400 Units total) by mouth daily. 30 tablet 2 Past Week  ? Cholecalciferol 50 MCG (2000 UT) TABS Take by mouth.   Past Week  ? estradiol (ESTRACE) 0.1 MG/GM vaginal cream Place 1 Applicatorful vaginally at bedtime.     ? levothyroxine (SYNTHROID) 75 MCG tablet Take 75 mcg by mouth daily before breakfast. WED/THURS/FRI/SAT   09/29/2021  ? losartan (COZAAR) 50 MG tablet Take 50 mg by mouth daily.   09/28/2021  ? mirtazapine (REMERON) 15 MG tablet Take 15 mg by mouth at bedtime.   09/28/2021  ? Multiple Vitamin (MULTIVITAMIN) tablet Take 1 tablet by mouth daily.   Past Week  ? nitroGLYCERIN (NITROSTAT) 0.4 MG SL tablet Place 0.4 mg under the tongue every 5 (five) minutes as needed for chest pain.     ? furosemide (LASIX) 20 MG tablet Take by mouth.     ? lactulose (CHRONULAC) 10 GM/15ML solution Take by mouth.     ? lactulose, encephalopathy, (CHRONULAC) 10 GM/15ML SOLN Take by mouth.     ?  levothyroxine (SYNTHROID) 88 MCG tablet Take 88 mcg by mouth daily before breakfast. SUN/MON/TUES     ? metroNIDAZOLE (FLAGYL) 500 MG tablet  (Patient not taking: Reported on 09/29/2021)   Not Taking  ? QUEtiapine (SEROQUEL) 300 MG tablet Take 2 tablets (600 mg total) by mouth at bedtime. (Patient not taking: Reported on 09/29/2021) 180 tablet 3 Not Taking  ? triamcinolone cream (KENALOG) 0.1 % Apply to affected areas on legs daily 2 weeks on and off     ? ? ? ?No Known Allergies ? ? ?Past Medical History:  ?Diagnosis Date  ? Adenomatous colon polyp   ? Bipolar disorder ([place])   ? Cardiomyopathy ([place])   ? Claudication [facility])   ? Clubbing of fingers   ? COPD (chronic obstructive pulmonary disease) ([place])   ? Depression   ? HCV (hepatitis C virus)   ? Heart murmur   ? Hep C w/o coma, chronic (HCC)   ? Hepatitis C   ? Hepatitis C   ? Hyperlipidemia   ? Hypertension   ? LGSIL of cervix of undetermined significance   ? Liver disease   ? Macrocytosis   ? Opioid abuse ([place])   ? Post-menopause   ? Schizophrenia ([place])   ? Syphilis   ? Syphilis   ? Thyroid disease   ? ? ?Review of systems:  Otherwise negative.  ? ? ?Physical  Exam ? ?Gen: Alert, oriented. Appears stated age.  ?HEENT: PERRLA. ?Lungs: No respiratory distress ?CV: RRR ?Abd: soft, benign, no masses ?Ext: No edema ? ? ? ?Planned procedures: Proceed with colonoscopy. The patient understands the nature of the planned procedure, indications, risks, alternatives and potential complications including but not limited to bleeding, infection, perforation, damage to internal organs and possible oversedation/side effects from anesthesia. The patient agrees and gives consent to proceed.  ?Please refer to procedure notes for findings, recommendations and patient disposition/instructions.  ? ? ? ?Lesly Rubenstein, MD ?[facility] ? ? ? ?  ? ?

## 2021-09-29 NOTE — Anesthesia Preprocedure Evaluation (Signed)
Anesthesia Evaluation  ?Patient identified by MRN, date of birth, ID band ?Patient awake ? ? ? ?Reviewed: ?Allergy & Precautions, H&P , NPO status , Patient's Chart, lab work & pertinent test results, reviewed documented beta blocker date and time  ? ?Airway ?Mallampati: III ? ? ?Neck ROM: full ? ? ? Dental ? ?(+) Poor Dentition ?  ?Pulmonary ?COPD,  COPD inhaler, Current Smoker,  ?  ?Pulmonary exam normal ? ? ? ? ? ? ? Cardiovascular ?Exercise Tolerance: Poor ?hypertension, On Medications ?Normal cardiovascular exam+ Valvular Problems/Murmurs  ?Rhythm:regular Rate:Normal ? ? ?  ?Neuro/Psych ?PSYCHIATRIC DISORDERS Depression Bipolar Disorder Schizophrenia negative neurological ROS ?   ? GI/Hepatic ?negative GI ROS, (+) Hepatitis -  ?Endo/Other  ?negative endocrine ROS ? Renal/GU ?negative Renal ROS  ?negative genitourinary ?  ?Musculoskeletal ? ? Abdominal ?  ?Peds ? Hematology ?negative hematology ROS ?(+)   ?Anesthesia Other Findings ?Past Medical History: ?No date: Adenomatous colon polyp ?No date: Bipolar disorder ([place]) ?No date: Cardiomyopathy [facility]) ?No date: Claudication [facility]) ?No date: Clubbing of fingers ?No date: COPD (chronic obstructive pulmonary disease) ([place]) ?No date: Depression ?No date: HCV (hepatitis C virus) ?No date: Heart murmur ?No date: Hep C w/o coma, chronic ([place]) ?No date: Hepatitis C ?No date: Hepatitis C ?No date: Hyperlipidemia ?No date: Hypertension ?No date: LGSIL of cervix of undetermined significance ?No date: Liver disease ?No date: Macrocytosis ?No date: Opioid abuse ([place]) ?No date: Post-menopause ?No date: Schizophrenia [facility]) ?No date: Syphilis ?No date: Syphilis ?No date: Thyroid disease ?Past Surgical History: ?No date: CESAREAN SECTION ?No date: COLONOSCOPY ?08/12/2020: COLONOSCOPY WITH PROPOFOL; N/A ?    Comment:  Procedure: COLONOSCOPY WITH PROPOFOL;  Surgeon:  ?             Lesly Rubenstein, MD;  Location: [facility] ENDOSCOPY;   ?              Service: Endoscopy;  Laterality: N/A;  COVID POSITIVE  ?             06/20/2020 ?No date: radioactive iodine thyroid ablation ? ? Reproductive/Obstetrics ?negative OB ROS ? ?  ? ? ? ? ? ? ? ? ? ? ? ? ? ?  ?  ? ? ? ? ? ? ? ? ?Anesthesia Physical ?Anesthesia Plan ? ?ASA: 3 ? ?Anesthesia Plan: General  ? ?Post-op Pain Management:   ? ?Induction:  ? ?PONV Risk Score and Plan:  ? ?Airway Management Planned:  ? ?Additional Equipment:  ? ?Intra-op Plan:  ? ?Post-operative Plan:  ? ?Informed Consent: I have reviewed the patients History and Physical, chart, labs and discussed the procedure including the risks, benefits and alternatives for the proposed anesthesia with the patient or authorized representative who has indicated his/her understanding and acceptance.  ? ? ? ?Dental Advisory Given ? ?Plan Discussed with: CRNA ? ?Anesthesia Plan Comments:   ? ? ? ? ? ? ?Anesthesia Quick Evaluation ? ?

## 2021-09-29 NOTE — Progress Notes (Signed)
Per Anesthesia, no need for UDS today.  ? ?

## 2021-09-29 NOTE — Interval H&P Note (Signed)
History and Physical Interval Note: ? ?09/29/2021 ?10:05 AM ? ?Connie Wright  has presented today for surgery, with the diagnosis of Personal hx of polyps.  The various methods of treatment have been discussed with the patient and family. After consideration of risks, benefits and other options for treatment, the patient has consented to  Procedure(s): ?COLONOSCOPY WITH PROPOFOL (N/A) as a surgical intervention.  The patient's history has been reviewed, patient examined, no change in status, stable for surgery.  I have reviewed the patient's chart and labs.  Questions were answered to the patient's satisfaction.   ? ? ?[name] ? ?Ok to proceed with colonoscopy ?

## 2021-09-30 ENCOUNTER — Encounter: Payer: Self-pay | Admitting: Gastroenterology

## 2021-09-30 NOTE — Anesthesia Postprocedure Evaluation (Signed)
Anesthesia Post Note ? ?Patient: Connie Wright ? ?Procedure(s) Performed: COLONOSCOPY WITH PROPOFOL ? ?Patient location during evaluation: PACU ?Anesthesia Type: MAC ?Level of consciousness: awake and alert ?Pain management: pain level controlled ?Vital Signs Assessment: post-procedure vital signs reviewed and stable ?Respiratory status: spontaneous breathing, nonlabored ventilation, respiratory function stable and patient connected to nasal cannula oxygen ?Cardiovascular status: blood pressure returned to baseline and stable ?Postop Assessment: no apparent nausea or vomiting ?Anesthetic complications: no ? ? ?No notable events documented. ? ? ?Last Vitals:  ?Vitals:  ? 09/29/21 1052 09/29/21 1053  ?BP:  (!) 175/67  ?Pulse:    ?Resp: 17 17  ?Temp:    ?SpO2:  98%  ?  ?Last Pain:  ?Vitals:  ? 09/29/21 1031  ?TempSrc: Temporal  ?PainSc:   ? ? ?  ?  ?  ?  ?  ?  ? ?Molli Barrows ? ? ? ? ?

## 2021-10-01 LAB — SURGICAL PATHOLOGY

## 2021-11-25 ENCOUNTER — Other Ambulatory Visit: Payer: Self-pay | Admitting: Family Medicine

## 2021-11-25 DIAGNOSIS — Z1231 Encounter for screening mammogram for malignant neoplasm of breast: Secondary | ICD-10-CM

## 2021-12-20 IMAGING — MG DIGITAL SCREENING BILAT W/ TOMO W/ CAD
8 series · 8 of 24 positions shown · non-contrast
Comparison: Previous exam(s).

CLINICAL DATA: Screening.

EXAM:
DIGITAL SCREENING BILATERAL MAMMOGRAM WITH TOMO AND CAD

[R MLO synth-2D]
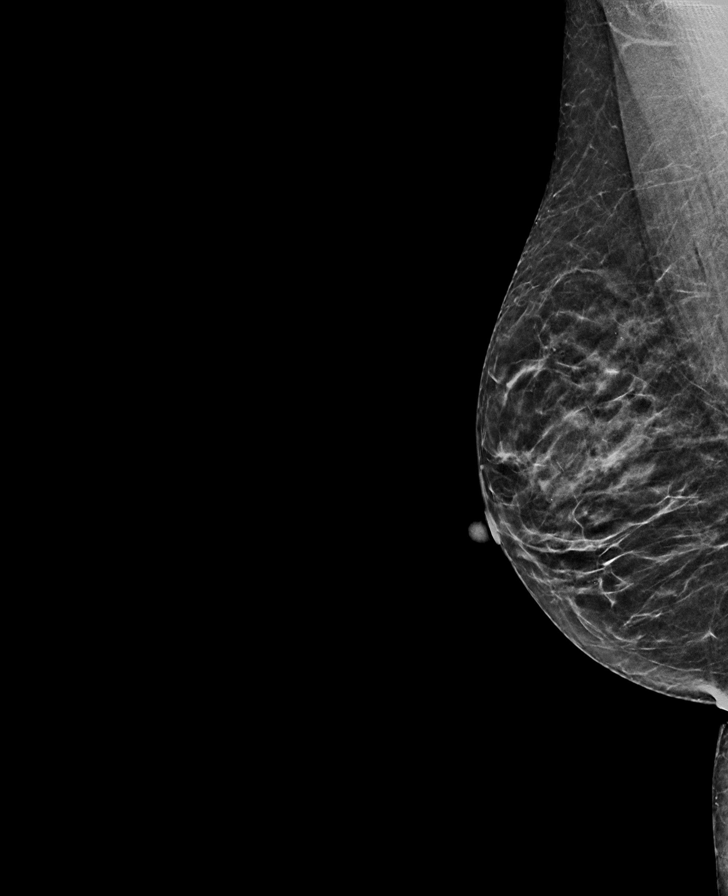

[R CC synth-2D]
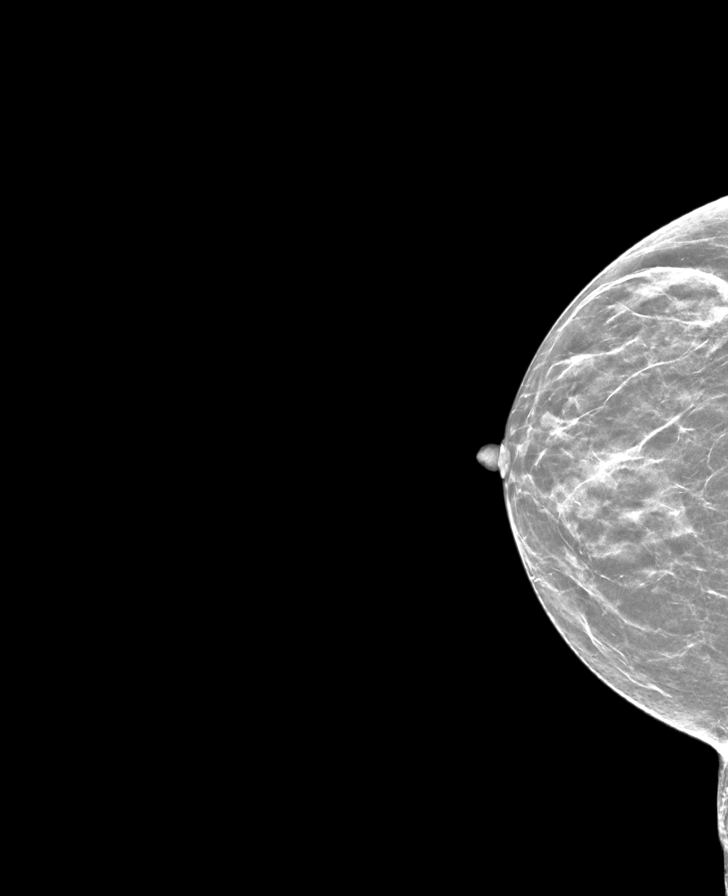

[L MLO synth-2D]
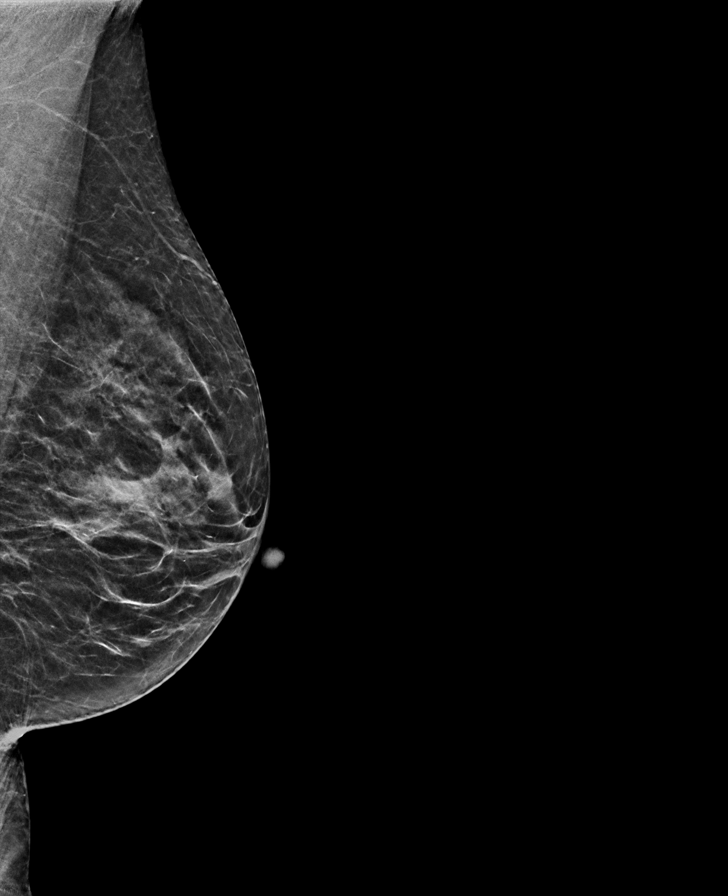

[L CC synth-2D]
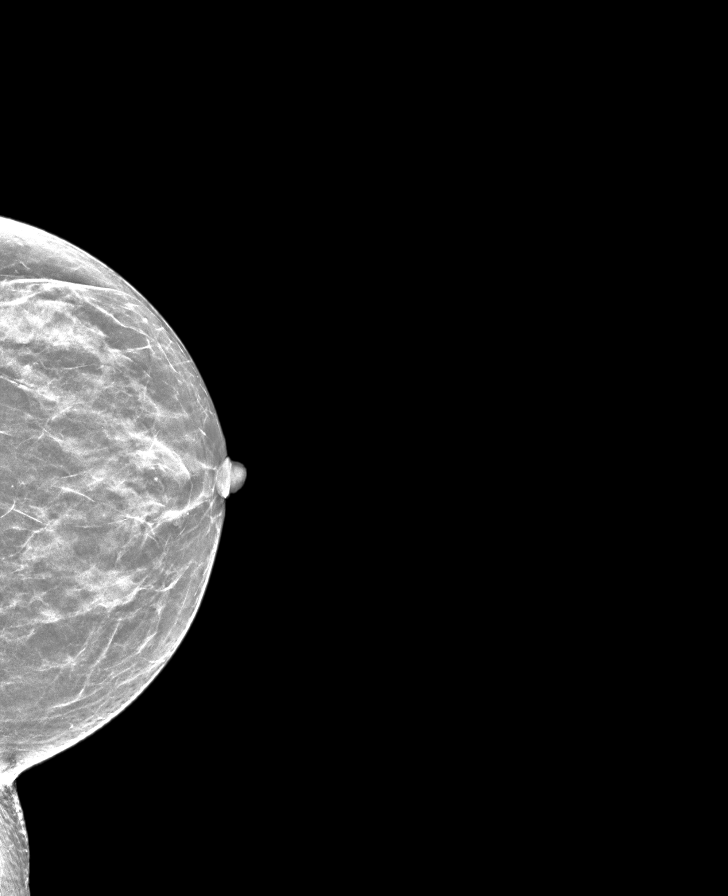

[R MLO tomo · tomo slice 24/47.0]
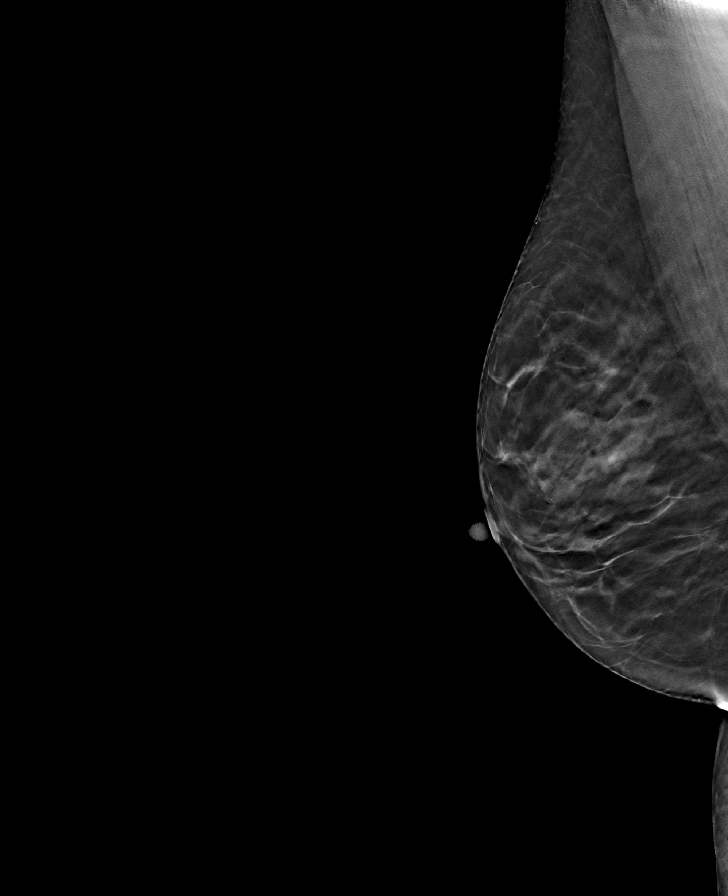

[L MLO tomo · tomo slice 25/48.0]
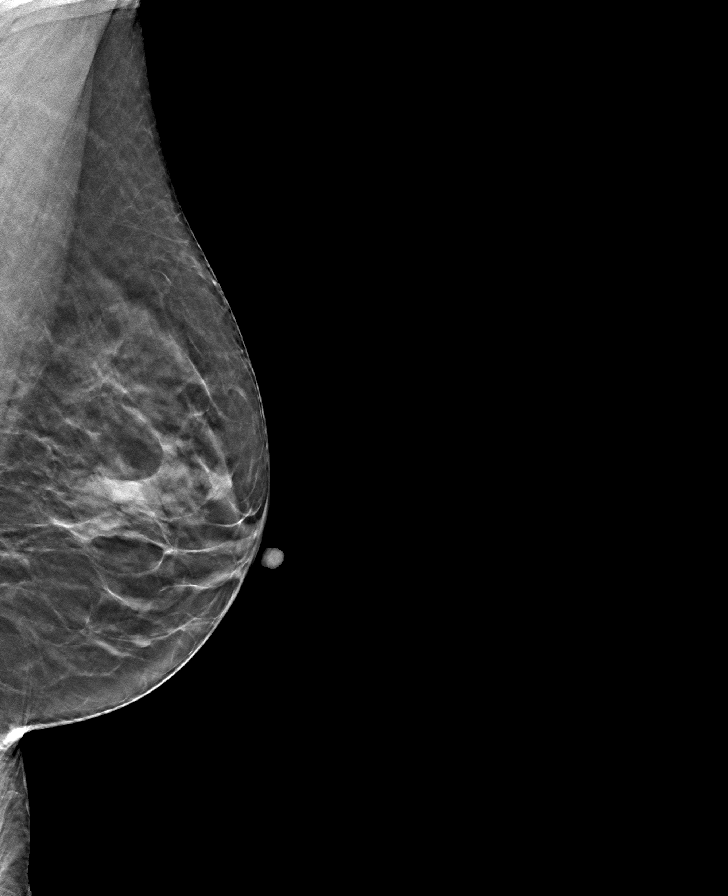

[L CC tomo · tomo slice 24/47.0]
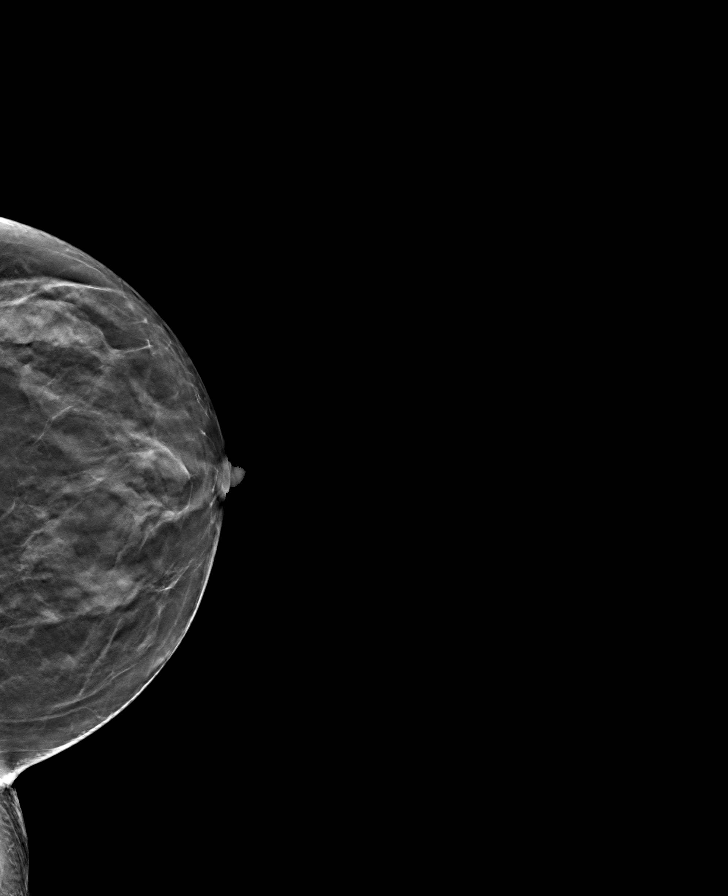

[R CC tomo · tomo slice 25/49.0]
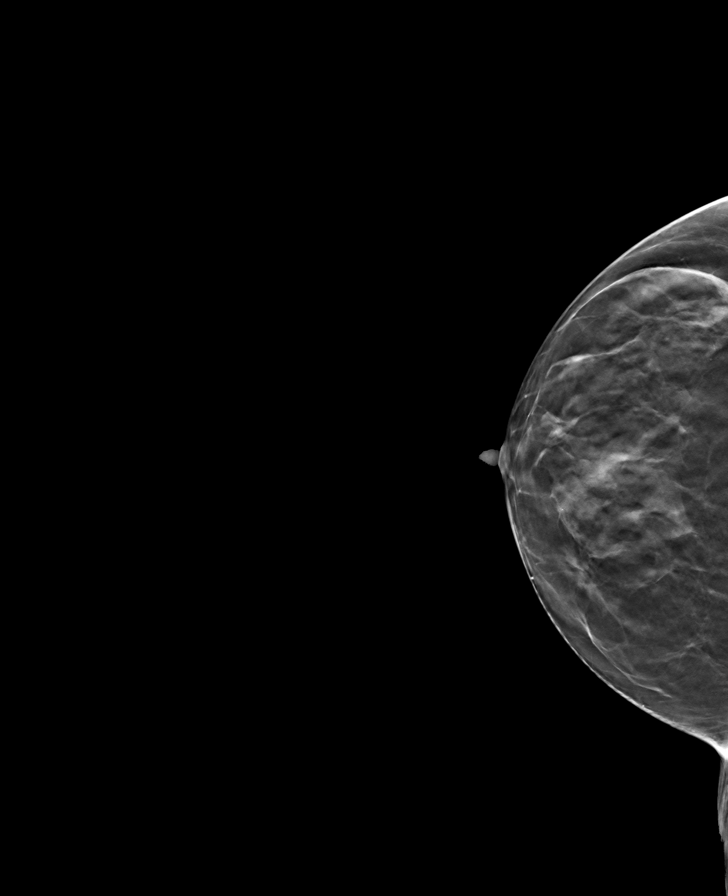

[8 of 24 positions shown; findings below may reference images not displayed]

ACR Breast Density Category c: The breast tissue is heterogeneously
dense, which may obscure small masses.
FINDINGS: There are no findings suspicious for malignancy. Images were
processed with CAD.
IMPRESSION: No mammographic evidence of malignancy. A result letter of this
screening mammogram will be mailed directly to the patient.

RECOMMENDATION:
Screening mammogram in one year. (Code:[id number]-[id number])

BI-RADS CATEGORY  1: Negative.

## 2021-12-30 ENCOUNTER — Ambulatory Visit: Payer: Medicaid Other

## 2022-07-21 ENCOUNTER — Ambulatory Visit
Admission: RE | Admit: 2022-07-21 | Discharge: 2022-07-21 | Disposition: A | Discharge status: Home / Self Care | Payer: 59 | Source: Ambulatory Visit | Attending: Family Medicine | Admitting: Family Medicine

## 2022-07-21 ENCOUNTER — Ambulatory Visit: Payer: Medicaid Other

## 2022-07-21 DIAGNOSIS — Z1231 Encounter for screening mammogram for malignant neoplasm of breast: Secondary | ICD-10-CM | POA: Diagnosis present

## 2023-07-11 ENCOUNTER — Other Ambulatory Visit: Payer: Self-pay | Admitting: Family Medicine

## 2023-07-11 DIAGNOSIS — Z780 Asymptomatic menopausal state: Secondary | ICD-10-CM

## 2023-07-11 DIAGNOSIS — Z1231 Encounter for screening mammogram for malignant neoplasm of breast: Secondary | ICD-10-CM

## 2023-07-12 ENCOUNTER — Other Ambulatory Visit: Payer: Self-pay | Admitting: Family Medicine

## 2023-07-12 DIAGNOSIS — Z1231 Encounter for screening mammogram for malignant neoplasm of breast: Secondary | ICD-10-CM

## 2023-08-24 ENCOUNTER — Other Ambulatory Visit (HOSPITAL_COMMUNITY): Payer: 59

## 2023-08-24 ENCOUNTER — Ambulatory Visit (HOSPITAL_COMMUNITY): Payer: 59

## 2023-08-31 ENCOUNTER — Ambulatory Visit
Admission: RE | Admit: 2023-08-31 | Discharge: 2023-08-31 | Disposition: A | Discharge status: Home / Self Care | Payer: 59 | Source: Ambulatory Visit | Attending: Family Medicine | Admitting: Family Medicine

## 2023-08-31 DIAGNOSIS — Z780 Asymptomatic menopausal state: Secondary | ICD-10-CM | POA: Insufficient documentation

## 2023-08-31 DIAGNOSIS — Z1231 Encounter for screening mammogram for malignant neoplasm of breast: Secondary | ICD-10-CM

## 2023-09-02 ENCOUNTER — Ambulatory Visit (INDEPENDENT_AMBULATORY_CARE_PROVIDER_SITE_OTHER)

## 2023-09-02 ENCOUNTER — Ambulatory Visit
Admission: EM | Admit: 2023-09-02 | Discharge: 2023-09-02 | Disposition: A | Discharge status: Home / Self Care | Attending: Nurse Practitioner | Admitting: Nurse Practitioner

## 2023-09-02 DIAGNOSIS — M79604 Pain in right leg: Secondary | ICD-10-CM

## 2023-09-02 DIAGNOSIS — M25551 Pain in right hip: Secondary | ICD-10-CM

## 2023-09-02 MED ORDER — KETOROLAC TROMETHAMINE 30 MG/ML IJ SOLN
30.0000 mg | Freq: Once | INTRAMUSCULAR | Status: AC
  Administered 2023-09-02: 30 mg via INTRAMUSCULAR

## 2023-09-02 MED ORDER — CYCLOBENZAPRINE HCL 5 MG PO TABS: 5.0000 mg | ORAL_TABLET | Freq: Every day | ORAL | 0 refills | Status: AC

## 2023-09-02 NOTE — ED Triage Notes (Signed)
 Pt c/o right side hip pain moving to her right foot x22months  Pt denies any new activities or exercise.

## 2023-09-02 NOTE — ED Provider Notes (Signed)
 [facility]    CSN: [id number] Arrival date & time: 09/02/23  1124      History   Chief Complaint Chief Complaint  Patient presents with   Hip Pain   Foot Pain    HPI Connie Wright is a 68 y.o. female.   HPI  She is in today for evaluation right hip and leg pain for the past 2 weeks.  She denies accident or injury.  She denies history of low back or hip pain.  She endorses that the pain starts in the buttock area and radiates down into the back of her leg sometimes into the heel..  She denies any numbness tingling.  She does have occasional burning pain.  She does have some weakness.  She has been using over-the-counter medication with little to no relief. Past Medical History:  Diagnosis Date   Adenomatous colon polyp    Bipolar disorder (HCC)    Cardiomyopathy (HCC)    Claudication (HCC)    Clubbing of fingers    COPD (chronic obstructive pulmonary disease) (HCC)    Depression    HCV (hepatitis C virus)    Heart murmur    Hep C w/o coma, chronic (HCC)    Hepatitis C    Hepatitis C    Hyperlipidemia    Hypertension    LGSIL of cervix of undetermined significance    Liver disease    Macrocytosis    Opioid abuse (HCC)    Post-menopause    Schizophrenia (HCC)    Syphilis    Syphilis    Thyroid disease     Patient Active Problem List   Diagnosis Date Noted   Bipolar I disorder, most recent episode depressed (HCC) 05/06/2015   Personal history of other infectious and parasitic diseases 05/06/2015   Bipolar 1 disorder, depressed (HCC) 02/06/2015   Club nail 02/06/2015   Personal history of infectious and parasitic disease 02/06/2015   HCV (hepatitis C virus) 02/06/2015   BP (high blood pressure) 02/06/2015   Drug abuse, opioid type (HCC) 02/06/2015   H/O therapeutic radiation 02/06/2015   Disease of thyroid gland 02/06/2015    Past Surgical History:  Procedure Laterality Date   CESAREAN SECTION     COLONOSCOPY     COLONOSCOPY WITH PROPOFOL  N/A 08/12/2020   Procedure: COLONOSCOPY WITH PROPOFOL;  Surgeon: Regis Bill, MD;  Location: [facility];  Service: Endoscopy;  Laterality: N/A;  COVID POSITIVE 06/20/2020   COLONOSCOPY WITH PROPOFOL N/A 09/29/2021   Procedure: COLONOSCOPY WITH PROPOFOL;  Surgeon: Regis Bill, MD;  Location: [facility] ENDOSCOPY;  Service: Endoscopy;  Laterality: N/A;   radioactive iodine thyroid ablation      OB History   No obstetric history on file.      Home Medications    Prior to Admission medications   Medication Sig Start Date End Date Taking? Authorizing Provider  aspirin 81 MG chewable tablet Chew by mouth. 02/15/14  Yes [provider]  atorvastatin (LIPITOR) 40 MG tablet Take 20 mg by mouth daily.   Yes [provider]  carvedilol (COREG) 25 MG tablet Take 25 mg by mouth 2 (two) times daily with a meal.   Yes [provider]  Cholecalciferol (VITAMIN D3) 400 units tablet Take 1 tablet (400 Units total) by mouth daily. 11/04/15  Yes Brandy Hale, MD  Cholecalciferol 50 MCG (2000 UT) TABS Take by mouth.   Yes [provider]  cyclobenzaprine (FLEXERIL) 5 MG tablet Take 1 tablet (5  mg total) by mouth at bedtime for 10 days. 09/02/23 09/12/23 Yes Barbette Merino, NP  estradiol (ESTRACE) 0.1 MG/GM vaginal cream Place 1 Applicatorful vaginally at bedtime.   Yes [provider]  furosemide (LASIX) 20 MG tablet Take by mouth. 02/03/15  Yes [provider]  lactulose (CHRONULAC) 10 GM/15ML solution Take by mouth. 08/31/17  Yes [provider]  lactulose, encephalopathy, (CHRONULAC) 10 GM/15ML SOLN Take by mouth. 02/15/14  Yes [provider]  levothyroxine (SYNTHROID) 75 MCG tablet Take 75 mcg by mouth daily before breakfast. WED/THURS/FRI/SAT   Yes [provider]  levothyroxine (SYNTHROID) 88 MCG tablet Take 88 mcg by mouth daily before breakfast. SUN/MON/TUES   Yes [provider]  losartan (COZAAR) 50 MG  tablet Take 50 mg by mouth daily.   Yes [provider]  Multiple Vitamin (MULTIVITAMIN) tablet Take 1 tablet by mouth daily.   Yes [provider]  nitroGLYCERIN (NITROSTAT) 0.4 MG SL tablet Place 0.4 mg under the tongue every 5 (five) minutes as needed for chest pain.   Yes [provider]  triamcinolone cream (KENALOG) 0.1 % Apply to affected areas on legs daily 2 weeks on and off 08/21/13  Yes [provider]    Family History Family History  Problem Relation Age of Onset   Cancer Mother    Cancer - Cervical Mother    Alcohol abuse Mother    Depression Mother    Anxiety disorder Mother    Alcohol abuse Father    Anxiety disorder Father    Depression Father    Cancer Father    Alcohol abuse Sister    Drug abuse Sister    Anxiety disorder Sister    Depression Sister    Bipolar disorder Sister    Schizophrenia Sister    Thyroid disease Sister    Deep vein thrombosis Sister    Stroke Sister    Thyroid disease Brother    Heart attack Brother    Breast cancer Neg Hx     Social History Social History   Tobacco Use   Smoking status: Every Day    Current packs/day: 0.10    Average packs/day: 0.1 packs/day for 48.6 years (4.9 ttl pk-yrs)    Types: Cigarettes    Start date: 02/06/1975   Smokeless tobacco: Never  Vaping Use   Vaping status: Never Used  Substance Use Topics   Alcohol use: Yes   Drug use: No    Comment: h/o IV drug use Cocaine , heavy drinker in the past      Allergies   Patient has no known allergies.   Review of Systems Review of Systems   Physical Exam Triage Vital Signs ED Triage Vitals  Encounter Vitals Group     BP 09/02/23 1135 (!) (P) 158/77     Systolic BP Percentile --      Diastolic BP Percentile --      Pulse Rate 09/02/23 1135 (P) 62     Resp --      Temp 09/02/23 1135 (!) (P) 97.5 F (36.4 C)     Temp Source 09/02/23 1135 (P) Oral     SpO2 09/02/23 1135 (P) 99 %     Weight 09/02/23 1133 120  lb (54.4 kg)     Height 09/02/23 1133 5\' 2"  (1.575 m)     Head Circumference --      Peak Flow --      Pain Score 09/02/23 1133 6  Pain Loc --      Pain Education --      Exclude from Growth Chart --    No data found.  Updated Vital Signs BP (!) (P) 158/77 (BP Location: Left Arm)   Pulse (P) 62   Temp (!) (P) 97.5 F (36.4 C) (Oral)   Ht 5\' 2"  (1.575 m)   Wt 120 lb (54.4 kg)   SpO2 (P) 99%   BMI 21.95 kg/m   Visual Acuity Right Eye Distance:   Left Eye Distance:   Bilateral Distance:    Right Eye Near:   Left Eye Near:    Bilateral Near:     Physical Exam Constitutional:      General: She is in acute distress.     Appearance: She is normal weight. She is not ill-appearing or diaphoretic.  Cardiovascular:     Rate and Rhythm: Normal rate.  Pulmonary:     Effort: Pulmonary effort is normal.  Musculoskeletal:     Lumbar back: No tenderness. Decreased range of motion. Negative right straight leg raise test and negative left straight leg raise test.     Right hip: Decreased range of motion. Decreased strength.     Left hip: Normal.  Neurological:     Mental Status: She is alert.      UC Treatments / Results  Labs (all labs ordered are listed, but only abnormal results are displayed) Labs Reviewed - No data to display  EKG   Radiology MM 3D SCREENING MAMMOGRAM BILATERAL BREAST Result Date: 09/02/2023 CLINICAL DATA:  Screening. EXAM: DIGITAL SCREENING BILATERAL MAMMOGRAM WITH TOMOSYNTHESIS AND CAD TECHNIQUE: Bilateral screening digital craniocaudal and mediolateral oblique mammograms were obtained. Bilateral screening digital breast tomosynthesis was performed. The images were evaluated with computer-aided detection. COMPARISON:  Previous exam(s). ACR Breast Density Category c: The breasts are heterogeneously dense, which may obscure small masses. FINDINGS: There are no findings suspicious for malignancy. IMPRESSION: No mammographic evidence of malignancy. A  result letter of this screening mammogram will be mailed directly to the patient. RECOMMENDATION: Screening mammogram in one year. (Code:SM-B-01Y) BI-RADS CATEGORY  1: Negative. Electronically Signed   By: Amie Portland M.D.   On: 09/02/2023 12:53   DG BONE DENSITY (DXA) Result Date: 08/31/2023 EXAM: DUAL X-RAY ABSORPTIOMETRY (DXA) FOR BONE MINERAL DENSITY IMPRESSION: Your patient Connie Wright completed a BMD test on 08/31/2023 using the [employer] Advance DXA System (analysis version: 14.10) manufactured by [employer]. The following summarizes the results of our evaluation. Technologist: LCE PATIENT BIOGRAPHICAL: Name: Connie Wright, Connie Wright Patient ID: [id number] Birth Date: [birth year] Height: 62.0 in. Gender: Female Exam Date: 08/31/2023 Weight: 126.8 lbs. Indications: Low Body Weight, POSTmenopausal, Tobacco User Fractures: Treatments: Vitamin D ASSESSMENT: The BMD measured at AP Spine L1-L2 is 0.841 g/cm2 with a T-score of -2.7. This patient is considered osteoporotic according to World Health Organization [facility]) criteria. L-3 and L-4 were excluded due to degenerative changes/compression fracture, etc. The scan quality is good. Site Region Measured Measured WHO Young Adult BMD Date       Age      Classification T-score AP Spine L1-L2 08/31/2023 66.2 Osteoporosis -2.7 0.841 g/cm2 DualFemur Total Left 08/31/2023 66.2 Osteopenia -2.3 0.714 g/cm2 World Health Organization [facility]) criteria for post-menopausal, Caucasian Women: Normal:       T-score at or above -1 SD Osteopenia:   T-score between -1 and -2.5 SD Osteoporosis: T-score at or below -2.5 SD RECOMMENDATIONS: 1. All patients should optimize calcium and vitamin D intake. 2. Consider  FDA-approved medical therapies in postmenopausal women and men aged 67 years and older, based on the following: a. A hip or vertebral (clinical or morphometric) fracture b. T-score < -2.5 at the femoral neck or spine after appropriate evaluation to exclude secondary causes c. Low  bone mass (T-score between -1.0 and -2.5 at the femoral neck or spine) and a 10-year probability of a hip fracture > 3% or a 10-year probability of a major osteoporosis-related fracture > 20% based on the [place]-adapted WHO algorithm d. Clinician judgment and/or patient preferences may indicate treatment for people with 10-year fracture probabilities above or below these levels FOLLOW-UP: People with diagnosed cases of osteoporosis or at high risk for fracture should have regular bone mineral density tests. For patients eligible for Medicare, routine testing is allowed once every 2 years. The testing frequency can be increased to one year for patients who have rapidly progressing disease, those who are receiving or discontinuing medical therapy to restore bone mass, or have additional risk factors. I have reviewed this report, and agree with the above findings. [facility] Electronically Signed   By: Baird Lyons M.D.   On: 08/31/2023 14:24    Procedures Procedures (including critical care time)  Medications Ordered in UC Medications  ketorolac (TORADOL) 30 MG/ML injection 30 mg (30 mg Intramuscular Given 09/02/23 1259)    Initial Impression / Assessment and Plan / UC Course  I have reviewed the triage vital signs and the nursing notes.  Pertinent labs & imaging results that were available during my care of the patient were reviewed by me and considered in my medical decision making (see chart for details).     Right leg pain Final Clinical Impressions(s) / UC Diagnoses   Final diagnoses:  Acute leg pain, right  Right hip pain     Discharge Instructions      There is some degenerative changes noted to the right hip area.  Your official over- read will be completed by radiology. You are provided a Toradol 30 mg IM injection to help with your pain.  This injection may provide you up to 3 days of relief.  You have also been prescribed cyclobenzaprine 5 mg nightly for muscle spasms.  You  are encouraged to avoid driving due to the risk of sedation. You have been provided information on hip pain.  You are encouraged to seek further assistance in [facility] if your symptoms persist or worsen.  They do offer walk-in visits and may be in and [place].     ED Prescriptions     Medication Sig Dispense Auth. Provider   cyclobenzaprine (FLEXERIL) 5 MG tablet Take 1 tablet (5 mg total) by mouth at bedtime for 10 days. 10 tablet Barbette Merino, NP      PDMP not reviewed this encounter.   [place], [place] 09/02/23 1316

## 2023-09-02 NOTE — Discharge Instructions (Addendum)
 There is some degenerative changes noted to the right hip area.  Your official over- read will be completed by radiology. You are provided a Toradol 30 mg IM injection to help with your pain.  This injection may provide you up to 3 days of relief.  You have also been prescribed cyclobenzaprine 5 mg nightly for muscle spasms.  You are encouraged to avoid driving due to the risk of sedation. You have been provided information on hip pain.  You are encouraged to seek further assistance in [facility] if your symptoms persist or worsen.  They do offer walk-in visits and may be in and [place].

## 2023-09-21 ENCOUNTER — Other Ambulatory Visit: Payer: Self-pay | Admitting: Physical Medicine & Rehabilitation

## 2023-09-21 DIAGNOSIS — M5442 Lumbago with sciatica, left side: Secondary | ICD-10-CM

## 2023-09-21 DIAGNOSIS — M5441 Lumbago with sciatica, right side: Secondary | ICD-10-CM

## 2023-10-05 ENCOUNTER — Ambulatory Visit
Admission: RE | Admit: 2023-10-05 | Discharge: 2023-10-05 | Disposition: A | Discharge status: Home / Self Care | Source: Ambulatory Visit | Attending: Physical Medicine & Rehabilitation | Admitting: Physical Medicine & Rehabilitation

## 2023-10-05 DIAGNOSIS — M5442 Lumbago with sciatica, left side: Secondary | ICD-10-CM

## 2023-10-05 DIAGNOSIS — M5441 Lumbago with sciatica, right side: Secondary | ICD-10-CM
